# Patient Record
Sex: Female | Born: 1955 | Race: White | Hispanic: No | Marital: Married | Smoking: Never smoker
Health system: Southern US, Community
[De-identification: ages and names within clinical notes are randomized; demographics above are authoritative.]

## PROBLEM LIST (undated history)

## (undated) DIAGNOSIS — C4490 Unspecified malignant neoplasm of skin, unspecified: Secondary | ICD-10-CM

## (undated) DIAGNOSIS — Z889 Allergy status to unspecified drugs, medicaments and biological substances status: Secondary | ICD-10-CM

## (undated) DIAGNOSIS — E559 Vitamin D deficiency, unspecified: Secondary | ICD-10-CM

## (undated) DIAGNOSIS — I872 Venous insufficiency (chronic) (peripheral): Secondary | ICD-10-CM

## (undated) DIAGNOSIS — N200 Calculus of kidney: Secondary | ICD-10-CM

## (undated) DIAGNOSIS — L509 Urticaria, unspecified: Secondary | ICD-10-CM

## (undated) HISTORY — PX: ADENOIDECTOMY: SUR15

## (undated) HISTORY — PX: TONSILLECTOMY: SUR1361

## (undated) HISTORY — DX: Allergy status to unspecified drugs, medicaments and biological substances: Z88.9

## (undated) HISTORY — DX: Calculus of kidney: N20.0

## (undated) HISTORY — DX: Urticaria, unspecified: L50.9

## (undated) HISTORY — DX: Venous insufficiency (chronic) (peripheral): I87.2

## (undated) HISTORY — DX: Vitamin D deficiency, unspecified: E55.9

## (undated) HISTORY — PX: TONSILLECTOMY: SHX5217

## (undated) HISTORY — PX: DERMOID CYST  EXCISION: SHX1452

## (undated) HISTORY — PX: APPENDECTOMY: SHX54

## (undated) HISTORY — DX: Unspecified malignant neoplasm of skin, unspecified: C44.90

---

## 1982-02-17 DIAGNOSIS — N200 Calculus of kidney: Secondary | ICD-10-CM

## 1982-02-17 HISTORY — DX: Calculus of kidney: N20.0

## 2000-03-02 ENCOUNTER — Ambulatory Visit (HOSPITAL_COMMUNITY): Admission: RE | Admit: 2000-03-02 | Discharge: 2000-03-02 | Payer: Self-pay | Admitting: Obstetrics and Gynecology

## 2000-03-02 ENCOUNTER — Encounter: Payer: Self-pay | Admitting: Obstetrics and Gynecology

## 2000-04-07 ENCOUNTER — Other Ambulatory Visit: Admission: RE | Admit: 2000-04-07 | Discharge: 2000-04-07 | Payer: Self-pay | Admitting: Obstetrics and Gynecology

## 2001-08-03 ENCOUNTER — Encounter: Payer: Self-pay | Admitting: Obstetrics and Gynecology

## 2001-08-03 ENCOUNTER — Ambulatory Visit (HOSPITAL_COMMUNITY): Admission: RE | Admit: 2001-08-03 | Discharge: 2001-08-03 | Payer: Self-pay | Admitting: Obstetrics and Gynecology

## 2001-08-18 ENCOUNTER — Other Ambulatory Visit: Admission: RE | Admit: 2001-08-18 | Discharge: 2001-08-18 | Payer: Self-pay | Admitting: Obstetrics and Gynecology

## 2002-09-29 ENCOUNTER — Ambulatory Visit (HOSPITAL_COMMUNITY): Admission: RE | Admit: 2002-09-29 | Discharge: 2002-09-29 | Payer: Self-pay | Admitting: Obstetrics and Gynecology

## 2002-09-29 ENCOUNTER — Encounter: Payer: Self-pay | Admitting: Obstetrics and Gynecology

## 2002-10-04 ENCOUNTER — Other Ambulatory Visit: Admission: RE | Admit: 2002-10-04 | Discharge: 2002-10-04 | Payer: Self-pay | Admitting: Obstetrics and Gynecology

## 2003-10-26 ENCOUNTER — Other Ambulatory Visit: Admission: RE | Admit: 2003-10-26 | Discharge: 2003-10-26 | Payer: Self-pay | Admitting: Obstetrics and Gynecology

## 2003-10-26 ENCOUNTER — Ambulatory Visit (HOSPITAL_COMMUNITY): Admission: RE | Admit: 2003-10-26 | Discharge: 2003-10-26 | Payer: Self-pay | Admitting: Obstetrics and Gynecology

## 2003-11-02 ENCOUNTER — Encounter: Admission: RE | Admit: 2003-11-02 | Discharge: 2003-11-02 | Payer: Self-pay | Admitting: Obstetrics and Gynecology

## 2004-11-14 ENCOUNTER — Ambulatory Visit (HOSPITAL_COMMUNITY): Admission: RE | Admit: 2004-11-14 | Discharge: 2004-11-14 | Payer: Self-pay | Admitting: Obstetrics and Gynecology

## 2004-11-26 ENCOUNTER — Other Ambulatory Visit: Admission: RE | Admit: 2004-11-26 | Discharge: 2004-11-26 | Payer: Self-pay | Admitting: Obstetrics and Gynecology

## 2005-11-25 ENCOUNTER — Ambulatory Visit (HOSPITAL_COMMUNITY): Admission: RE | Admit: 2005-11-25 | Discharge: 2005-11-25 | Payer: Self-pay | Admitting: Obstetrics and Gynecology

## 2007-01-26 ENCOUNTER — Ambulatory Visit (HOSPITAL_COMMUNITY): Admission: RE | Admit: 2007-01-26 | Discharge: 2007-01-26 | Payer: Self-pay | Admitting: Obstetrics and Gynecology

## 2007-01-29 ENCOUNTER — Encounter: Admission: RE | Admit: 2007-01-29 | Discharge: 2007-01-29 | Payer: Self-pay | Admitting: Obstetrics and Gynecology

## 2007-08-10 ENCOUNTER — Encounter: Admission: RE | Admit: 2007-08-10 | Discharge: 2007-08-10 | Payer: Self-pay | Admitting: Obstetrics and Gynecology

## 2008-02-18 DIAGNOSIS — C4490 Unspecified malignant neoplasm of skin, unspecified: Secondary | ICD-10-CM

## 2008-02-18 HISTORY — DX: Unspecified malignant neoplasm of skin, unspecified: C44.90

## 2008-02-21 ENCOUNTER — Encounter: Admission: RE | Admit: 2008-02-21 | Discharge: 2008-02-21 | Payer: Self-pay | Admitting: Obstetrics and Gynecology

## 2009-04-09 ENCOUNTER — Ambulatory Visit (HOSPITAL_COMMUNITY): Admission: RE | Admit: 2009-04-09 | Discharge: 2009-04-09 | Payer: Self-pay | Admitting: Obstetrics and Gynecology

## 2010-05-09 ENCOUNTER — Other Ambulatory Visit (HOSPITAL_COMMUNITY): Payer: Self-pay | Admitting: Obstetrics and Gynecology

## 2010-05-09 DIAGNOSIS — Z1231 Encounter for screening mammogram for malignant neoplasm of breast: Secondary | ICD-10-CM

## 2010-05-16 ENCOUNTER — Ambulatory Visit (HOSPITAL_COMMUNITY)
Admission: RE | Admit: 2010-05-16 | Discharge: 2010-05-16 | Disposition: A | Discharge status: Home / Self Care | Payer: BC Managed Care – PPO | Source: Ambulatory Visit | Attending: Obstetrics and Gynecology | Admitting: Obstetrics and Gynecology

## 2010-05-16 DIAGNOSIS — Z1231 Encounter for screening mammogram for malignant neoplasm of breast: Secondary | ICD-10-CM | POA: Insufficient documentation

## 2011-05-21 ENCOUNTER — Telehealth: Payer: Self-pay

## 2011-05-21 NOTE — Telephone Encounter (Signed)
Sally Le, pt would like to speak with you about a personal matter. Please call.

## 2011-05-22 NOTE — Telephone Encounter (Signed)
Tried calling pt multpile times--number busy.

## 2011-05-23 NOTE — Telephone Encounter (Signed)
Pt notified. Ok with this

## 2011-05-23 NOTE — Telephone Encounter (Signed)
Spoke with patient and she stated that two weeks ago, she noticed blood in her stool.  It was bright red to dark red and lasted for 2 to 3 days..  She is taking an herb called stinging metal which has a coumadin property in it.  She was also taking 600 mg of ibuprofen and 200 iu of vitamin e along with this medicine.  She stopped the stinging metal and the symptoms went away but wanted to make sure she would be ok.  She has an appointment with Chelle on the 18th.

## 2011-05-23 NOTE — Telephone Encounter (Signed)
I think that waiting until the appt is ok because the symptoms have resolved but I would not take the supplements again.

## 2011-06-05 ENCOUNTER — Ambulatory Visit (INDEPENDENT_AMBULATORY_CARE_PROVIDER_SITE_OTHER): Payer: BC Managed Care – PPO | Admitting: Physician Assistant

## 2011-06-05 ENCOUNTER — Encounter: Payer: Self-pay | Admitting: Physician Assistant

## 2011-06-05 VITALS — BP 130/80 | HR 76 | Temp 98.6°F | Resp 16 | Ht 65.0 in | Wt 181.0 lb

## 2011-06-05 DIAGNOSIS — R002 Palpitations: Secondary | ICD-10-CM

## 2011-06-05 NOTE — Patient Instructions (Signed)
Keep working on healthy eating and regular exercise!  Let's recheck the cholesterol in the next 6 months (come fasting!).

## 2011-06-05 NOTE — Progress Notes (Signed)
  Subjective:    Patient ID: Sally Le, female    DOB: 08-01-1955, 56 y.o.   MRN: [id number]  HPI  Presents for re-evaluation of palpitations and recheck lipids after CPE. Is not fasting. Working on getting regular exercise, 5 x weekly, difficulty getting that in recently.  Has had no palpitations since her last visit.  Review of Systems No chest pain, SOB, HA, dizziness, vision change, N/V, diarrhea, dysuria, myalgias, arthralgias or rash.     Objective:   Physical Exam  Vital signs noted. Well-developed, well nourished WF who is awake, alert and oriented, in NAD. HEENT: [place]/AT, sclera and conjunctiva are clear.   Neck: supple, non-tender, no lymphadenopathy, thyromegaly. Heart: RRR, no murmur Lungs: CTA Skin: warm and dry without rash.       Assessment & Plan:   1. Palpitations -resolved  2.  Hyperlipidemia-repeat fasting lipids in 3-6 months, per her convenience.  In the meantime, continue efforts for healthy lifestyle changes.

## 2011-07-01 ENCOUNTER — Other Ambulatory Visit (HOSPITAL_COMMUNITY): Payer: Self-pay | Admitting: Obstetrics and Gynecology

## 2011-07-01 DIAGNOSIS — Z1231 Encounter for screening mammogram for malignant neoplasm of breast: Secondary | ICD-10-CM

## 2011-08-05 ENCOUNTER — Ambulatory Visit (HOSPITAL_COMMUNITY)
Admission: RE | Admit: 2011-08-05 | Discharge: 2011-08-05 | Disposition: A | Discharge status: Home / Self Care | Payer: BC Managed Care – PPO | Source: Ambulatory Visit | Attending: Obstetrics and Gynecology | Admitting: Obstetrics and Gynecology

## 2011-08-05 DIAGNOSIS — Z1231 Encounter for screening mammogram for malignant neoplasm of breast: Secondary | ICD-10-CM | POA: Insufficient documentation

## 2012-02-26 ENCOUNTER — Encounter: Payer: Self-pay | Admitting: Physician Assistant

## 2012-02-26 ENCOUNTER — Ambulatory Visit (INDEPENDENT_AMBULATORY_CARE_PROVIDER_SITE_OTHER): Payer: BC Managed Care – PPO | Admitting: Physician Assistant

## 2012-02-26 VITALS — BP 133/71 | HR 83 | Temp 98.6°F | Resp 16 | Ht 65.0 in | Wt 180.0 lb

## 2012-02-26 DIAGNOSIS — L219 Seborrheic dermatitis, unspecified: Secondary | ICD-10-CM | POA: Insufficient documentation

## 2012-02-26 DIAGNOSIS — Z0000 Encounter for general adult medical examination without abnormal findings: Secondary | ICD-10-CM

## 2012-02-26 LAB — CBC WITH DIFFERENTIAL/PLATELET
Basophils Absolute: 0 10*3/uL (ref 0.0–0.1)
Basophils Relative: 1 % (ref 0–1)
Eosinophils Absolute: 0.1 10*3/uL (ref 0.0–0.7)
Eosinophils Relative: 2 % (ref 0–5)
HCT: 41.3 % (ref 36.0–46.0)
Hemoglobin: 13.8 g/dL (ref 12.0–15.0)
Lymphocytes Relative: 32 % (ref 12–46)
Lymphs Abs: 1.7 10*3/uL (ref 0.7–4.0)
MCH: 30.2 pg (ref 26.0–34.0)
MCHC: 33.4 g/dL (ref 30.0–36.0)
MCV: 90.4 fL (ref 78.0–100.0)
Monocytes Absolute: 0.5 10*3/uL (ref 0.1–1.0)
Monocytes Relative: 10 % (ref 3–12)
Neutro Abs: 2.9 10*3/uL (ref 1.7–7.7)
Neutrophils Relative %: 55 % (ref 43–77)
Platelets: 250 10*3/uL (ref 150–400)
RBC: 4.57 MIL/uL (ref 3.87–5.11)
RDW: 13.8 % (ref 11.5–15.5)
WBC: 5.2 10*3/uL (ref 4.0–10.5)

## 2012-02-26 LAB — LIPID PANEL
Cholesterol: 217 mg/dL — ABNORMAL HIGH (ref 0–200)
HDL: 80 mg/dL (ref >39)
LDL Cholesterol: 125 mg/dL — ABNORMAL HIGH (ref 0–99)
Total CHOL/HDL Ratio: 2.7 Ratio
Triglycerides: 58 mg/dL (ref <150)
VLDL: 12 mg/dL (ref 0–40)

## 2012-02-26 LAB — POCT UA - MICROSCOPIC ONLY
Bacteria, U Microscopic: NEGATIVE
Casts, Ur, LPF, POC: NEGATIVE
Crystals, Ur, HPF, POC: NEGATIVE
Yeast, UA: NEGATIVE

## 2012-02-26 LAB — TSH: TSH: 1.506 u[IU]/mL (ref 0.350–4.500)

## 2012-02-26 LAB — POCT URINALYSIS DIPSTICK
Bilirubin, UA: NEGATIVE
Glucose, UA: NEGATIVE
Ketones, UA: NEGATIVE
Nitrite, UA: NEGATIVE
Protein, UA: NEGATIVE
Spec Grav, UA: <=1.005
Urobilinogen, UA: 0.2
pH, UA: 5.5

## 2012-02-26 LAB — COMPREHENSIVE METABOLIC PANEL
ALT: 18 U/L (ref 0–35)
AST: 22 U/L (ref 0–37)
Albumin: 4.6 g/dL (ref 3.5–5.2)
Alkaline Phosphatase: 72 U/L (ref 39–117)
BUN: 18 mg/dL (ref 6–23)
CO2: 28 mEq/L (ref 19–32)
Calcium: 10.1 mg/dL (ref 8.4–10.5)
Chloride: 107 mEq/L (ref 96–112)
Creat: 0.91 mg/dL (ref 0.50–1.10)
Glucose, Bld: 94 mg/dL (ref 70–99)
Potassium: 4.5 mEq/L (ref 3.5–5.3)
Sodium: 140 mEq/L (ref 135–145)
Total Bilirubin: 0.5 mg/dL (ref 0.3–1.2)
Total Protein: 7.2 g/dL (ref 6.0–8.3)

## 2012-02-26 NOTE — Progress Notes (Signed)
Subjective:    Patient ID: Sally Le, female    DOB: [birth year], 57 y.o.   MRN: [id number]  HPI This 57 y.o. female presents for Annual Wellness Exam.  Breast exam, mammogram and pap testing already done this year with GYN.  Colonoscopy current.  "I don't get flu shots."  Needs health maintenance exam and labs.  Past Medical History  Diagnosis Date  . Vitamin D deficiency   . Venous insufficiency   . Nephrolithiasis 1984  . History of seasonal allergies   . 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 Appendectomy   . Dermoid cyst  excision   . Tonsillectomy   . Adenoidectomy     Prior to Admission medications   Medication Sig Start Date End Date Taking? Authorizing Provider  AMBULATORY NON FORMULARY MEDICATION Take 1.5 oz by mouth daily. Medication Name: Xango mangosteen juice   Yes Historical Provider, MD  B Complex-C (B-COMPLEX WITH VITAMIN C) tablet Take 1 tablet by mouth daily.   Yes Historical Provider, MD  CALCIUM-MAGNESIUM PO Take by mouth 3 (three) times daily.   Yes Historical Provider, MD  CHERRY PO Take 500 mg by mouth daily.   Yes Historical Provider, MD  Cholecalciferol (VITAMIN D PO) Take 2,000 Units/day by mouth daily.   Yes Historical Provider, MD  magnesium 30 MG tablet Take 30 mg by mouth 2 (two) times daily.   Yes Historical Provider, MD  Omega-3 Fatty Acids (FISH OIL) 1200 MG CAPS Take 1,200 mg by mouth daily.   Yes Historical Provider, MD  VITAMIN E COMPLEX PO Take 200 Units by mouth daily.   Yes Historical Provider, MD    Allergies  Allergen Reactions  . Adhesive (Tape) Rash    The adhesive, not the tape is the source of the problem.  . Erythromycin     nausea    History   Social History  . Marital Status: Married    Spouse Name: Daphine Deutscher    Number of Children: 0  . Years of Education: 15   Occupational History  . Teacher    Social History Main Topics  . Smoking  status: Never Smoker   . Smokeless tobacco: Never Used  . Alcohol Use: 0.6 oz/week    1 Glasses of wine per week  . Drug Use: No  . Sexually Active: Yes -- Female partner(s)    Birth Control/ Protection: Post-menopausal   Other Topics Concern  . Not on file   Social History Narrative   Exercises 4xweekly.  Lives with her husband.    Family History  Problem Relation Age of Onset  . Parkinson's disease Mother   . Breast cancer Mother   . Cancer Mother 12    Estrogen and progestin positive breast cancer  . Cancer Father     oat cell lung CA with mets to brain  . Pancreatic cancer Maternal Grandfather   . Cancer Maternal Grandfather     pancreatic  . Lung cancer    . Obesity Sister   . Spondylolisthesis Sister   . Asthma Sister   . Cancer Maternal Aunt 58    lung cancer (non-smoker)  . Heart disease Paternal Grandmother     Review of Systems  Constitutional: Negative.   HENT: Negative.   Eyes: Negative.   Respiratory: Negative.   Cardiovascular: Positive for palpitations and leg swelling. Negative for chest pain.  Gastrointestinal: Negative.  Genitourinary: Negative.   Musculoskeletal: Negative.   Skin: Negative.   Neurological: Negative.   Hematological: Negative.   Psychiatric/Behavioral: Negative.        Objective:   Physical Exam  Vitals reviewed. Constitutional: She is oriented to person, place, and time. Vital signs are normal. She appears well-developed and well-nourished. No distress.  HENT:  Head: Normocephalic and atraumatic.  Right Ear: Hearing, tympanic membrane, external ear and ear canal normal. No foreign bodies.  Left Ear: Hearing, tympanic membrane, external ear and ear canal normal. No foreign bodies.  Nose: Nose normal.  Mouth/Throat: Uvula is midline, oropharynx is clear and moist and mucous membranes are normal. No oral lesions. Normal dentition. No dental abscesses or uvula swelling. No oropharyngeal exudate.  Eyes: Conjunctivae normal and  EOM are normal. Pupils are equal, round, and reactive to light. Right eye exhibits no discharge. Left eye exhibits no discharge. No scleral icterus.  Fundoscopic exam:      The right eye shows no arteriolar narrowing, no AV nicking, no exudate, no hemorrhage and no papilledema. The right eye shows red reflex.The right eye shows no venous pulsations.      The left eye shows no arteriolar narrowing, no AV nicking, no exudate, no hemorrhage and no papilledema. The left eye shows red reflex.The left eye shows no venous pulsations. Neck: Trachea normal, normal range of motion and full passive range of motion without pain. Neck supple. No spinous process tenderness and no muscular tenderness present. No mass and no thyromegaly present.  Cardiovascular: Normal rate, regular rhythm, normal heart sounds, intact distal pulses and normal pulses.   Pulmonary/Chest: Effort normal and breath sounds normal.  Musculoskeletal: She exhibits no edema and no tenderness.       Cervical back: Normal.       Thoracic back: Normal.       Lumbar back: Normal.  Lymphadenopathy:       Head (right side): No tonsillar, no preauricular, no posterior auricular and no occipital adenopathy present.       Head (left side): No tonsillar, no preauricular, no posterior auricular and no occipital adenopathy present.    She has no cervical adenopathy.       Right: No supraclavicular adenopathy present.       Left: No supraclavicular adenopathy present.  Neurological: She is alert and oriented to person, place, and time. She has normal strength and normal reflexes. No cranial nerve deficit. She exhibits normal muscle tone. Coordination and gait normal.  Skin: Skin is warm, dry and intact. No rash noted. She is not diaphoretic. No cyanosis or erythema. Nails show no clubbing.  Psychiatric: She has a normal mood and affect. Her speech is normal and behavior is normal. Judgment and thought content normal.   Results for orders placed in  visit on 02/26/12  POCT URINALYSIS DIPSTICK      Component Value Range   Color, UA yellow     Clarity, UA clear     Glucose, UA neg     Bilirubin, UA neg     Ketones, UA neg     Spec Grav, UA <=1.005     Blood, UA trace     pH, UA 5.5     Protein, UA neg     Urobilinogen, UA 0.2     Nitrite, UA neg     Leukocytes, UA Trace    POCT UA - MICROSCOPIC ONLY      Component Value Range   WBC, Ur, HPF, POC 0-3  RBC, urine, microscopic 0-2     Bacteria, U Microscopic neg     Mucus, UA trace     Epithelial cells, urine per micros 0-2     Crystals, Ur, HPF, POC neg     Casts, Ur, LPF, POC neg     Yeast, UA neg        Assessment & Plan:   1. Routine general medical examination at a health care facility  POCT urinalysis dipstick, POCT UA - Microscopic Only, CBC with Differential, Comprehensive metabolic panel, Lipid panel, TSH  2. Seborrheic dermatitis  Continue mangosteen juice   Age appropriate anticipatory guidance provided.

## 2012-02-26 NOTE — Patient Instructions (Signed)

## 2012-02-27 ENCOUNTER — Encounter: Payer: Self-pay | Admitting: Physician Assistant

## 2012-04-03 ENCOUNTER — Other Ambulatory Visit: Payer: Self-pay

## 2012-09-21 ENCOUNTER — Other Ambulatory Visit (HOSPITAL_COMMUNITY): Payer: Self-pay | Admitting: Obstetrics and Gynecology

## 2012-09-21 DIAGNOSIS — Z1231 Encounter for screening mammogram for malignant neoplasm of breast: Secondary | ICD-10-CM

## 2012-10-14 ENCOUNTER — Ambulatory Visit (HOSPITAL_COMMUNITY): Payer: BC Managed Care – PPO

## 2012-10-14 ENCOUNTER — Ambulatory Visit (HOSPITAL_COMMUNITY)
Admission: RE | Admit: 2012-10-14 | Discharge: 2012-10-14 | Disposition: A | Discharge status: Home / Self Care | Payer: BC Managed Care – PPO | Source: Ambulatory Visit | Attending: Obstetrics and Gynecology | Admitting: Obstetrics and Gynecology

## 2012-10-14 DIAGNOSIS — Z1231 Encounter for screening mammogram for malignant neoplasm of breast: Secondary | ICD-10-CM | POA: Insufficient documentation

## 2012-12-23 ENCOUNTER — Other Ambulatory Visit: Payer: Self-pay

## 2013-05-16 ENCOUNTER — Telehealth: Payer: Self-pay

## 2013-05-16 NOTE — Telephone Encounter (Signed)
Error

## 2013-09-07 ENCOUNTER — Other Ambulatory Visit (HOSPITAL_COMMUNITY): Payer: Self-pay | Admitting: Obstetrics and Gynecology

## 2013-09-07 DIAGNOSIS — Z1231 Encounter for screening mammogram for malignant neoplasm of breast: Secondary | ICD-10-CM

## 2013-10-27 ENCOUNTER — Ambulatory Visit (HOSPITAL_COMMUNITY)
Admission: RE | Admit: 2013-10-27 | Discharge: 2013-10-27 | Disposition: A | Discharge status: Home / Self Care | Payer: BC Managed Care – PPO | Source: Ambulatory Visit | Attending: Obstetrics and Gynecology | Admitting: Obstetrics and Gynecology

## 2013-10-27 DIAGNOSIS — Z1231 Encounter for screening mammogram for malignant neoplasm of breast: Secondary | ICD-10-CM | POA: Diagnosis not present

## 2013-12-02 ENCOUNTER — Other Ambulatory Visit: Payer: Self-pay

## 2014-11-10 ENCOUNTER — Other Ambulatory Visit (HOSPITAL_COMMUNITY): Payer: Self-pay | Admitting: Obstetrics and Gynecology

## 2014-11-10 DIAGNOSIS — Z1231 Encounter for screening mammogram for malignant neoplasm of breast: Secondary | ICD-10-CM

## 2014-11-13 ENCOUNTER — Ambulatory Visit (HOSPITAL_COMMUNITY)
Admission: RE | Admit: 2014-11-13 | Discharge: 2014-11-13 | Disposition: A | Discharge status: Home / Self Care | Payer: BLUE CROSS/BLUE SHIELD | Source: Ambulatory Visit | Attending: Obstetrics and Gynecology | Admitting: Obstetrics and Gynecology

## 2014-11-13 DIAGNOSIS — Z1231 Encounter for screening mammogram for malignant neoplasm of breast: Secondary | ICD-10-CM | POA: Diagnosis not present

## 2015-05-21 IMAGING — MG MM DIGITAL SCREENING BILAT
4 series · 4 of 4 positions shown · non-contrast
Comparison: Previous exam(s).

CLINICAL DATA: Screening.

EXAM:
DIGITAL SCREENING BILATERAL MAMMOGRAM WITH CAD

[L CC]
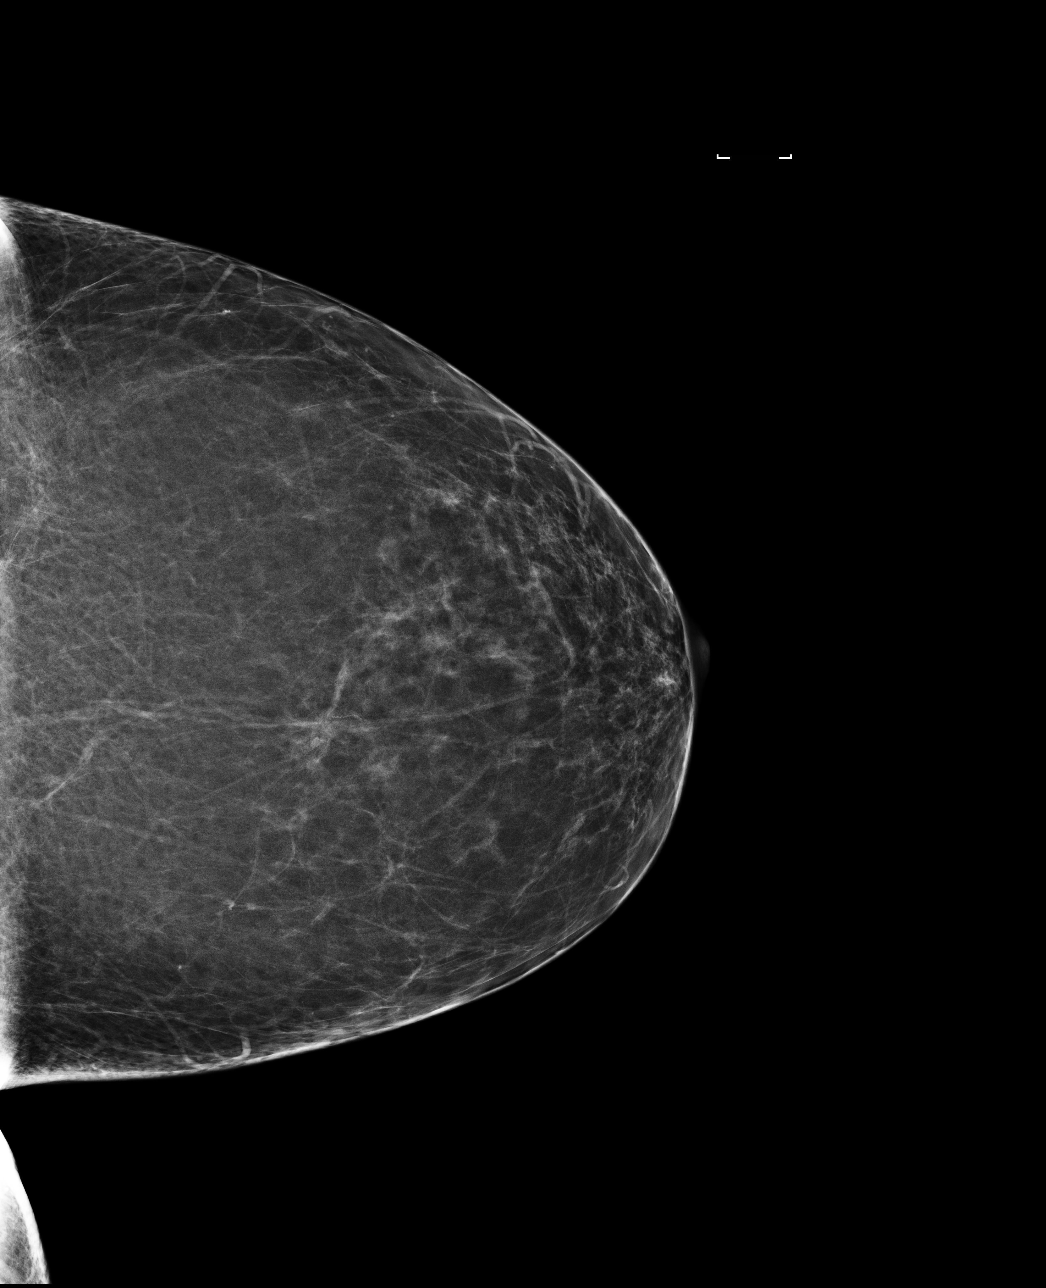

[R MLO]
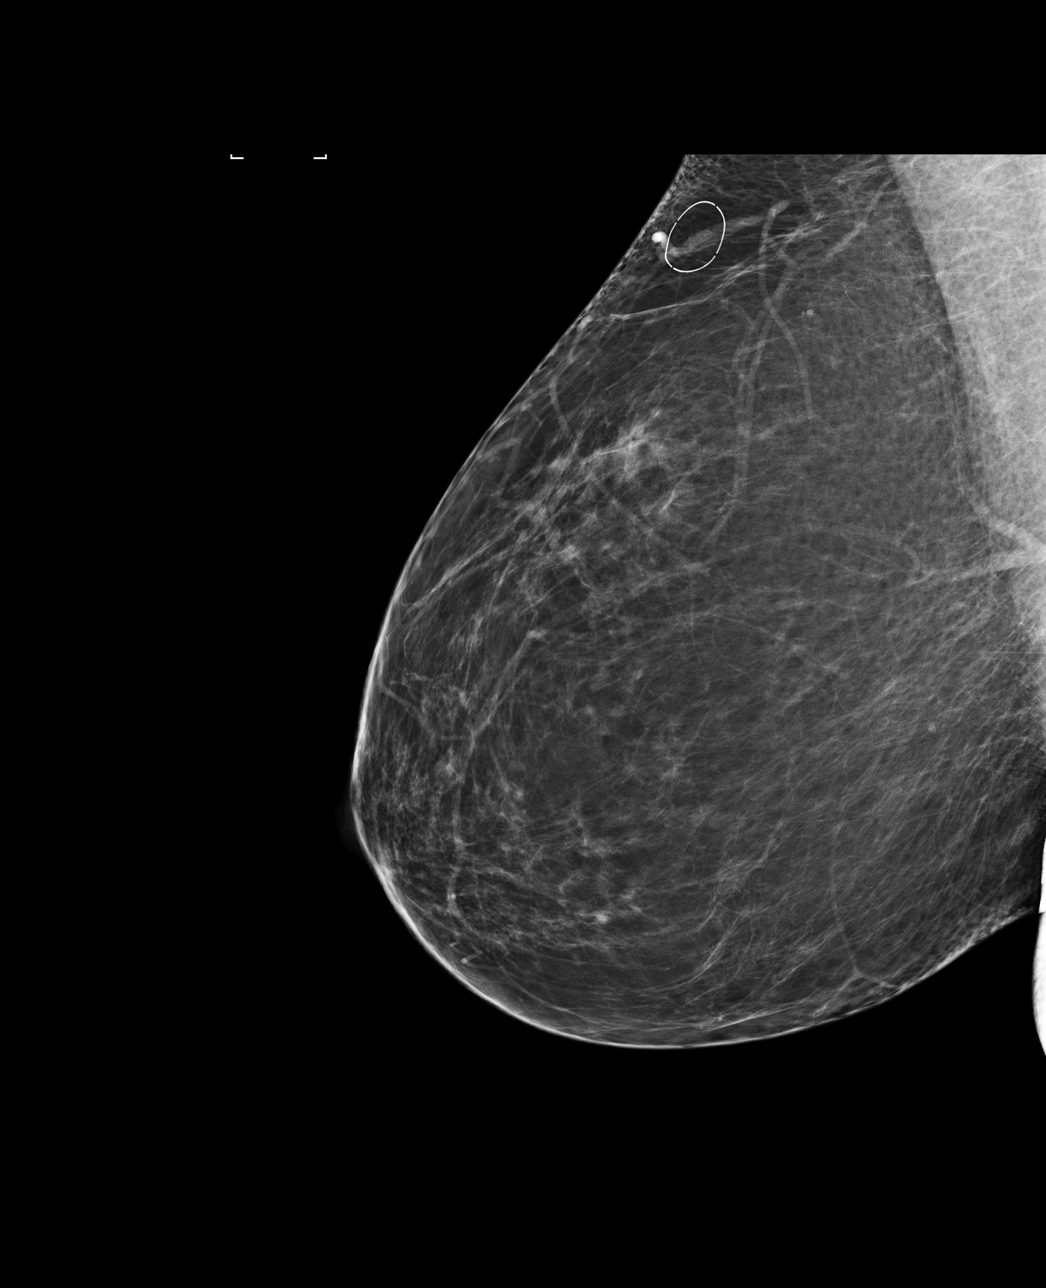

[L MLO]
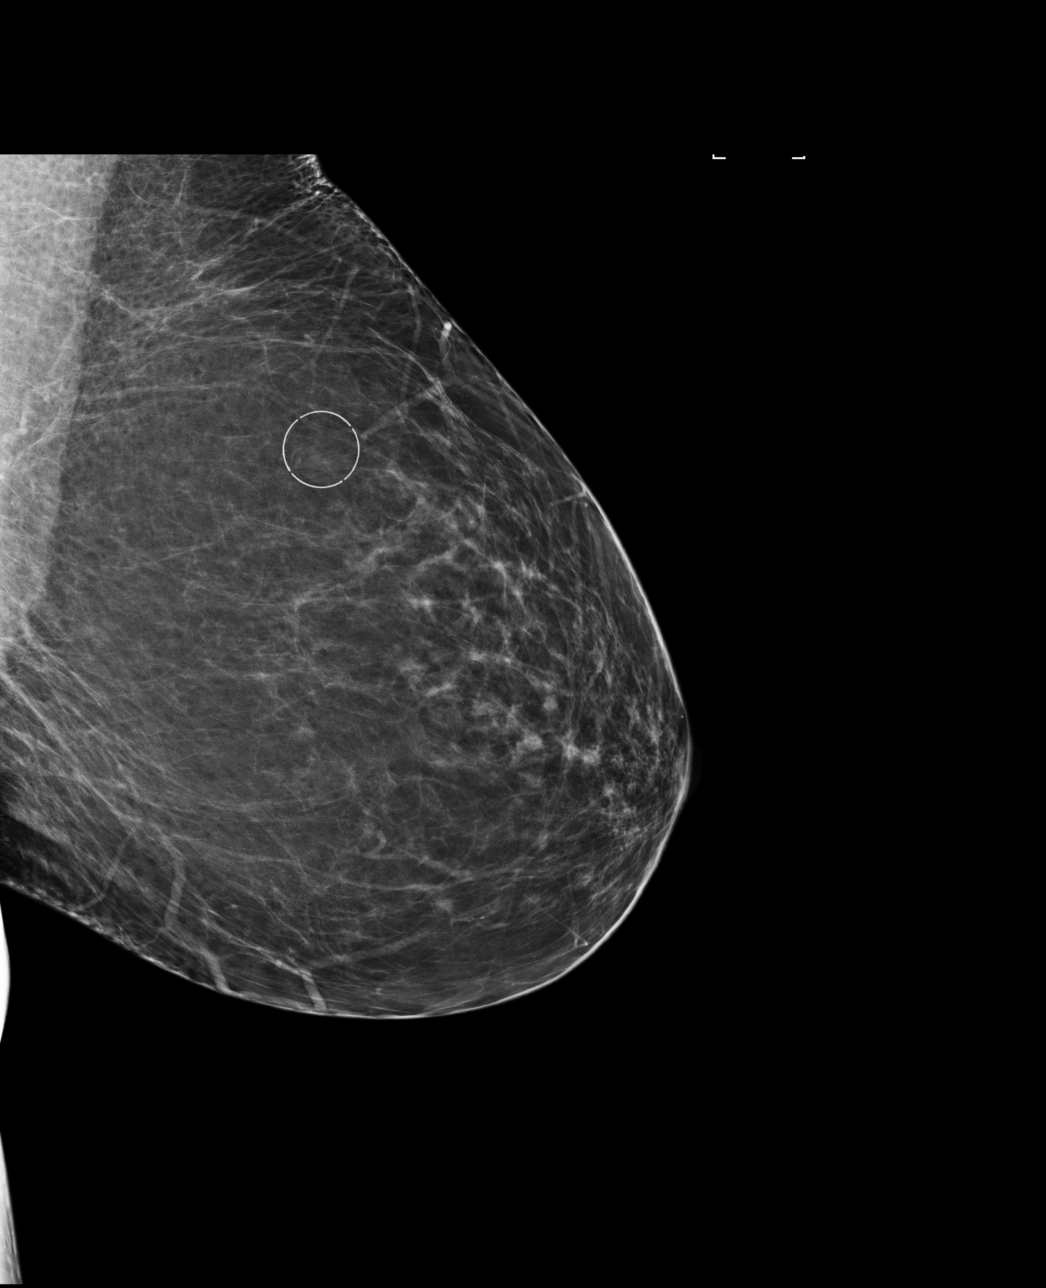

[R CC]
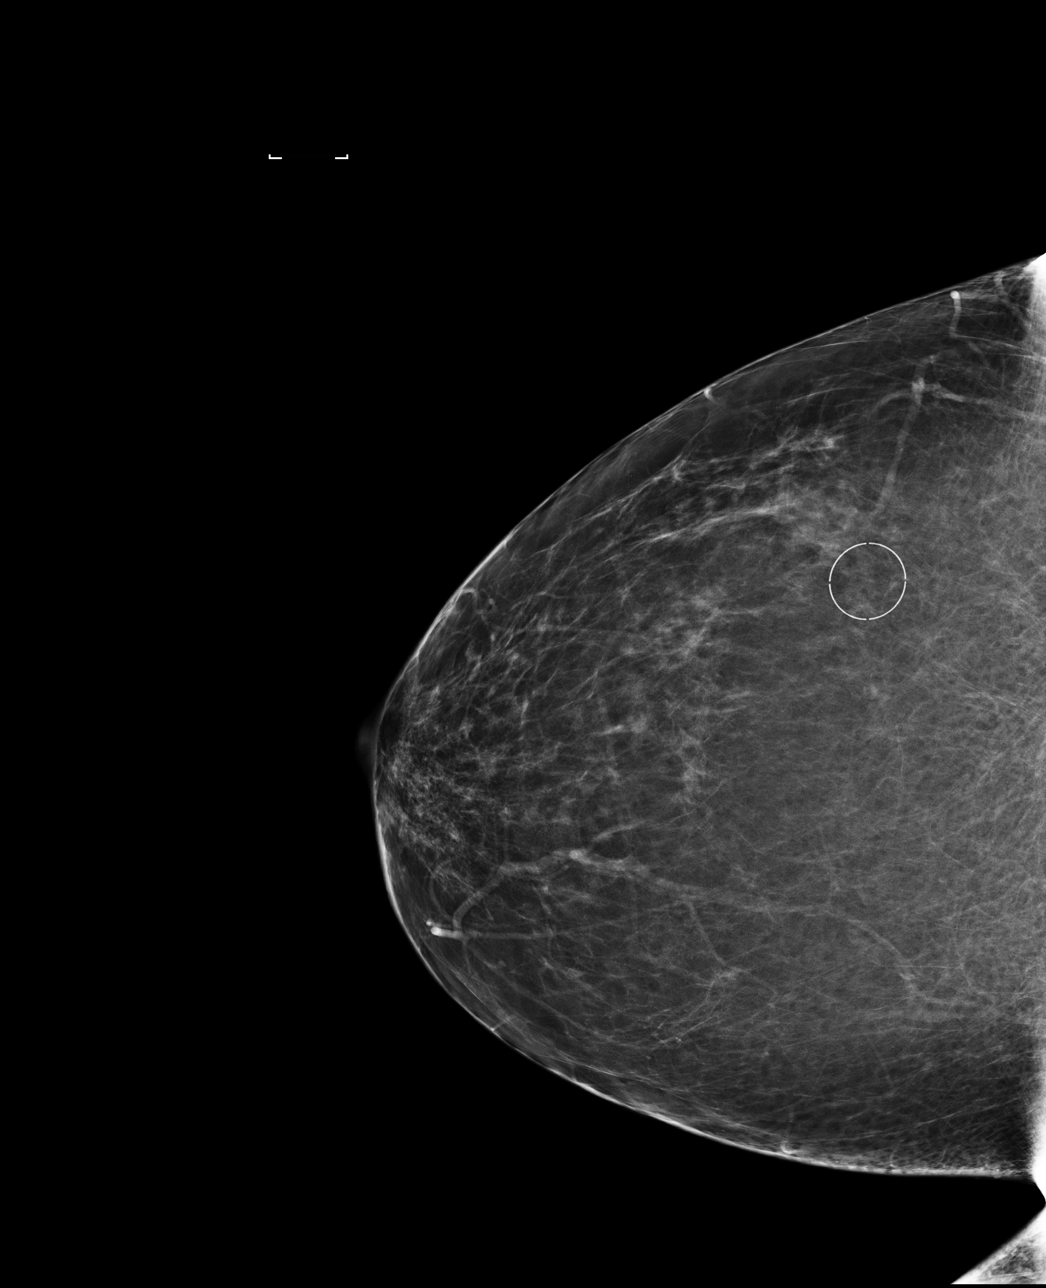

[4 of 4 positions shown; findings below may reference images not displayed]

ACR Breast Density Category b: There are scattered areas of
fibroglandular density.
FINDINGS: There are no findings suspicious for malignancy. Images were
processed with CAD.
IMPRESSION: No mammographic evidence of malignancy. A result letter of this
screening mammogram will be mailed directly to the patient.

RECOMMENDATION:
Screening mammogram in one year. (Code:AS-G-LCT)

BI-RADS CATEGORY  1: Negative.

## 2015-12-28 ENCOUNTER — Telehealth: Payer: Self-pay

## 2016-01-01 ENCOUNTER — Ambulatory Visit: Payer: BLUE CROSS/BLUE SHIELD

## 2016-06-04 NOTE — Telephone Encounter (Signed)
error 

## 2017-05-05 ENCOUNTER — Encounter: Payer: Self-pay | Admitting: Allergy and Immunology

## 2017-05-05 ENCOUNTER — Ambulatory Visit: Payer: BLUE CROSS/BLUE SHIELD | Admitting: Allergy and Immunology

## 2017-05-05 DIAGNOSIS — T7840XD Allergy, unspecified, subsequent encounter: Secondary | ICD-10-CM

## 2017-05-05 DIAGNOSIS — T7840XA Allergy, unspecified, initial encounter: Secondary | ICD-10-CM | POA: Insufficient documentation

## 2017-05-05 DIAGNOSIS — Z889 Allergy status to unspecified drugs, medicaments and biological substances status: Secondary | ICD-10-CM | POA: Diagnosis not present

## 2017-05-05 NOTE — Progress Notes (Signed)
New Patient Note  RE: Sally Le MRN: [id number] DOB: 11/27/1955 Date of Office Visit: 05/05/2017  Referring provider: Linus Mako, MD Primary care provider: Marda Stalker, PA-C  Chief Complaint: Allergic Reaction and Urticaria   History of present illness: Sally Le is a 62 y.o. female seen today in consultation requested by Linus Mako, MD.  She reports that she underwent sclerotherapy in 2008 in 2009.  The day after her last treatment in 2009, she experienced pruritus of her hands and feet.  In 2017, she underwent sclerotherapy again and the next day developed pruritus of her hands and feet and believes that she may have developed a few urticarial lesions as well.  2 weeks later, she underwent another round of sclerotherapy and within 12-24 hours developed urticarial lesions at the injection site.  She took antihistamines in an attempt to control the urticaria.  The technician noted that she had rested her latex-gloved hand on Lemma's leg in an area where the urticarial lesions became particularly dense.  She did not experience concomitant angioedema, cardiopulmonary symptoms, or GI symptoms.   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  Katharyn will return in the near future for latex skin testing and, if negative, challenge.  If latex skin test and challenge are both negative, we will proceed to skin testing and challenge of local anesthetic agent.  This agent will be determined by the patient's dermatologist's and dentist's preference.  If testing to latex and local anesthetic agent are both negative, the microfoam sclerotherapy agent is the most likely culprit and an alternative agent will need to be considered.     Physical examination: Blood pressure (!) 142/70, pulse 96, temperature 98.1 F (36.7 C), temperature source Oral, resp.  rate 20, height 5\' 5"  (1.651 m), weight 181 lb 3.2 oz (82.2 kg), last menstrual period 06/05/2011.  General: Alert, interactive, in no acute distress. Neck: Supple without lymphadenopathy. Lungs: Clear to auscultation without wheezing, rhonchi or rales. CV: Normal S1, S2 without murmurs. Abdomen: Nondistended, nontender. Skin: Warm and dry, without lesions or rashes. Extremities:  No clubbing, cyanosis or edema. Neuro:   Grossly intact.  Review of systems:  Review of systems negative except as noted in HPI / PMHx or noted below: Review of Systems  Constitutional: Negative.   HENT: Negative.   Eyes: Negative.   Respiratory: Negative.   Cardiovascular: Negative.   Gastrointestinal: Negative.   Genitourinary: Negative.   Musculoskeletal: Negative.   Skin: Negative.   Neurological: Negative.   Endo/Heme/Allergies: Negative.   Psychiatric/Behavioral: Negative.     Past medical history:  Past Medical History:  Diagnosis Date  . History of seasonal allergies   . Nephrolithiasis 1984  . 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 Urticaria   . Venous insufficiency   . Vitamin D deficiency     Past surgical history:  Past Surgical History:  Procedure Laterality Date  . ADENOIDECTOMY    . APPENDECTOMY    . DERMOID CYST  EXCISION    . TONSILLECTOMY    . TONSILLECTOMY      Family history: Family History  Problem Relation Age of Onset  . Parkinson's disease Mother   . Breast cancer Mother   . Cancer Mother 12       Estrogen and progestin positive breast cancer  . Cancer Father        oat  cell lung CA with mets to brain  . Pancreatic cancer Maternal Grandfather   . Cancer Maternal Grandfather        pancreatic  . Obesity Sister   . Spondylolisthesis Sister   . Asthma Sister   . Cancer Maternal Aunt 86       lung cancer (non-smoker)  . Heart disease Paternal Grandmother   . Lung cancer Unknown     Social history: Social History     Socioeconomic History  . Marital status: Married    Spouse name: Hassell Done  . Number of children: 0  . Years of education: 23  . Highest education level: Not on file  Social Needs  . Financial resource strain: Not on file  . Food insecurity - worry: Not on file  . Food insecurity - inability: Not on file  . Transportation needs - medical: Not on file  . Transportation needs - non-medical: Not on file  Occupational History  . Occupation: Product manager: [employer]  Tobacco Use  . Smoking status: Never Smoker  . Smokeless tobacco: Never Used  Substance and Sexual Activity  . Alcohol use: Yes    Alcohol/week: 0.6 oz    Types: 1 Glasses of wine per week  . Drug use: No  . Sexual activity: Yes    Partners: Male    Birth control/protection: Post-menopausal  Other Topics Concern  . Not on file  Social History Narrative   Exercises 4xweekly.  Lives with her husband.   Environmental History: The patient lives in a 64 year old house with carpeting throughout and central air/heat.  She is a non-smoker without pets.  There is no known mold/water damage in the home.  Allergies as of 05/05/2017      Reactions   Adhesive [tape] Rash   The adhesive, not the tape is the source of the problem.   Erythromycin    nausea      Medication List        Accurate as of 05/05/17  5:47 PM. Always use your most recent med list.          AMBULATORY NON FORMULARY MEDICATION Take 1.5 oz by mouth daily. Medication Name: Shea Evans mangosteen juice   B-complex with vitamin C tablet Take 1 tablet by mouth daily.   CALCIUM-MAGNESIUM PO Take by mouth 3 (three) times daily.   CHERRY PO Take 500 mg by mouth daily.   Fish Oil 1200 MG Caps Take 1,200 mg by mouth daily.   magnesium 30 MG tablet Take 30 mg by mouth 2 (two) times daily.   VITAMIN D PO Take 2,000 Units/day by mouth daily.   VITAMIN E COMPLEX PO Take 200 Units by mouth daily.       Known  medication allergies: Allergies  Allergen Reactions  . Adhesive [Tape] Rash    The adhesive, not the tape is the source of the problem.  . Erythromycin     nausea    I appreciate the opportunity to take part in Yaire's care. Please do not hesitate to contact me with questions.  Sincerely,   R. Edgar Frisk, MD

## 2017-05-05 NOTE — Assessment & Plan Note (Addendum)
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  Sally Le will return in the near future for latex skin testing and, if negative, challenge.  If latex skin test and challenge are both negative, we will proceed to skin testing and challenge of local anesthetic agent.  This agent will be determined by the patient's dermatologist's and dentist's preference.  If testing to latex and local anesthetic agent are both negative, the microfoam sclerotherapy agent is the most likely culprit and an alternative agent will need to be considered.

## 2017-05-05 NOTE — Patient Instructions (Signed)
Allergic reaction The patient's history suggests hypersensitivity to latex, local anesthetic caine agent, or micro-foam sclerotherapy agent.  Sally Le will return in the near future for latex skin testing and, if negative, challenge.  If latex skin test and challenge are both negative, we will proceed to skin testing and challenge of local anesthetic agent.  This agent will be determined by the patient's dermatologist's and dentist's preference.  If testing to latex and local anesthetic agent are both negative, the microfoam sclerotherapy agent is the most likely culprit and an alternative agent will need to be considered.   Return for latex testing/challenge.

## 2017-06-08 ENCOUNTER — Ambulatory Visit: Payer: BLUE CROSS/BLUE SHIELD | Admitting: Allergy and Immunology

## 2017-06-08 ENCOUNTER — Encounter: Payer: Self-pay | Admitting: Allergy and Immunology

## 2017-06-08 VITALS — BP 130/78 | HR 79 | Resp 18

## 2017-06-08 DIAGNOSIS — T7840XD Allergy, unspecified, subsequent encounter: Secondary | ICD-10-CM | POA: Diagnosis not present

## 2017-06-08 DIAGNOSIS — Z889 Allergy status to unspecified drugs, medicaments and biological substances status: Secondary | ICD-10-CM | POA: Diagnosis not present

## 2017-06-08 DIAGNOSIS — L500 Allergic urticaria: Secondary | ICD-10-CM | POA: Diagnosis not present

## 2017-06-08 NOTE — Assessment & Plan Note (Signed)
Sally Le's history suggests hypersensitivity to latex, local anesthetic caine agent, or micro-foam sclerotherapy agent.  Skin tests to latex were negative today at all delutions despite a positive histamine control.  In addition, she was able to tolerate wearing latex gloves, 1 with powder and 1 without powder, during the challenge period without symptoms.  Sally Le will return in the near future for lidocaine skin testing and, if negative, challenge.  If lidocaine skin test and challenge are negative, the microfoam sclerotherapy agent is the most likely culprit and an alternative agent will need to be considered.

## 2017-06-08 NOTE — Patient Instructions (Signed)
Allergic reaction Sally Le's history suggests hypersensitivity to latex, local anesthetic caine agent, or micro-foam sclerotherapy agent.  Skin tests to latex were negative today at all delutions despite a positive histamine control.  In addition, she was able to tolerate wearing latex gloves, 1 with powder and 1 without powder, during the challenge period without symptoms.  Sally Le will return in the near future for lidocaine skin testing and, if negative, challenge.  If lidocaine skin test and challenge are negative, the microfoam sclerotherapy agent is the most likely culprit and an alternative agent will need to be considered.   Return for lidocaine skin test/challenge.

## 2017-06-08 NOTE — Progress Notes (Signed)
Follow-up Note  RE: Sally Le MRN: [id number] DOB: July 18, 1955 Date of Office Visit: 06/08/2017  Primary care provider: Marda Stalker, PA-C Referring provider: Linus Mako, MD  History of present illness: Sally Le is a 62 y.o. female with history of allergic reactions here today for latex testing and, if negative, challenge.  She was previously seen in this clinic for her initial evaluation on May 05, 2017.  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  It is unclear if these reactions were due to contact with latex, the local anesthetic agent used, or the Microfoam agent used during the procedure.  Please see history of present illness from that visit for details.  Assessment and plan: Allergic reaction Sally Le history suggests hypersensitivity to latex, local anesthetic caine agent, or micro-foam sclerotherapy agent.  Skin tests to latex were negative today at all delutions despite a positive histamine control.  In addition, she was able to tolerate wearing latex gloves, 1 with powder and 1 without powder, during the challenge period without symptoms.  Sally Le will return in the near future for lidocaine skin testing and, if negative, challenge.  If lidocaine skin test and challenge are negative, the microfoam sclerotherapy agent is the most likely culprit and an alternative agent will need to be considered.   Diagnostics: Skin tests to latex were negative today at all delutions despite a positive histamine control.  In addition, she was able to tolerate wearing latex gloves, 1 with powder and 1 without powder, during the 15 minute challenge period without symptoms. She was asymptomatic with stable vital signs throughout the 1 hour observation period following.     Physical examination: Blood pressure 130/78, pulse 79, resp. rate 18, last menstrual period 06/05/2011, SpO2 97 %.  General: Alert, interactive, in no acute distress. Neck: Supple  without lymphadenopathy. Lungs: Clear to auscultation without wheezing, rhonchi or rales. CV: Normal S1, S2 without murmurs. Skin: Warm and dry, without lesions or rashes.  The following portions of the patient's history were reviewed and updated as appropriate: allergies, current medications, past family history, past medical history, past social history, past surgical history and problem list.  Allergies as of 06/08/2017      Reactions   Adhesive [tape] Rash   The adhesive, not the tape is the source of the problem.   Erythromycin    nausea      Medication List        Accurate as of 06/08/17  7:21 PM. Always use your most recent med list.          AMBULATORY NON FORMULARY MEDICATION Take 1.5 oz by mouth daily. Medication Name: Shea Evans mangosteen juice   B-complex with vitamin C tablet Take 1 tablet by mouth daily.   CALCIUM-MAGNESIUM PO Take by mouth 3 (three) times daily.   CHERRY PO Take 500 mg by mouth daily.   Fish Oil 1200 MG Caps Take 1,200 mg by mouth daily.   magnesium 30 MG tablet Take 30 mg by mouth 2 (two) times daily.   nystatin cream Commonly known as:  MYCOSTATIN   VITAMIN D PO Take 2,000 Units/day by mouth daily.   VITAMIN E COMPLEX PO Take 200 Units by mouth daily.       Allergies  Allergen Reactions  . Adhesive [Tape] Rash    The adhesive, not the tape is the source of the problem.  . Erythromycin     nausea   Review of systems: Review of systems negative except  as noted in HPI / PMHx or noted below: Constitutional: Negative.  HENT: Negative.   Eyes: Negative.  Respiratory: Negative.   Cardiovascular: Negative.  Gastrointestinal: Negative.  Genitourinary: Negative.  Musculoskeletal: Negative.  Neurological: Negative.  Endo/Heme/Allergies: Negative.  Cutaneous: Negative.  Past Medical History:  Diagnosis Date  . History of seasonal allergies   . Nephrolithiasis 1984  . 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, pre-basal  cell RIGHT cheek  . Urticaria   . Venous insufficiency   . Vitamin D deficiency     Family History  Problem Relation Age of Onset  . Parkinson's disease Mother   . Breast cancer Mother   . Cancer Mother 14       Estrogen and progestin positive breast cancer  . Cancer Father        oat cell lung CA with mets to brain  . Pancreatic cancer Maternal Grandfather   . Cancer Maternal Grandfather        pancreatic  . Obesity Sister   . Spondylolisthesis Sister   . Asthma Sister   . Cancer Maternal Aunt 86       lung cancer (non-smoker)  . Heart disease Paternal Grandmother   . Lung cancer Unknown     Social History   Socioeconomic History  . Marital status: Married    Spouse name: Hassell Done  . Number of children: 0  . Years of education: 33  . Highest education level: Not on file  Occupational History  . Occupation: Product manager: [employer]  . Financial resource strain: Not on file  . Food insecurity:    Worry: Not on file    Inability: Not on file  . Transportation needs:    Medical: Not on file    Non-medical: Not on file  Tobacco Use  . Smoking status: Never Smoker  . Smokeless tobacco: Never Used  Substance and Sexual Activity  . Alcohol use: Yes    Alcohol/week: 0.6 oz    Types: 1 Glasses of wine per week  . Drug use: No  . Sexual activity: Yes    Partners: Male    Birth control/protection: Post-menopausal  Lifestyle  . Physical activity:    Days per week: Not on file    Minutes per session: Not on file  . Stress: Not on file  Relationships  . Social connections:    Talks on phone: Not on file    Gets together: Not on file    Attends religious service: Not on file    Active member of club or organization: Not on file    Attends meetings of clubs or organizations: Not on file    Relationship status: Not on file  . Intimate partner violence:    Fear of current or ex partner: Not on file    Emotionally  abused: Not on file    Physically abused: Not on file    Forced sexual activity: Not on file  Other Topics Concern  . Not on file  Social History Narrative   Exercises 4xweekly.  Lives with her husband.    I appreciate the opportunity to take part in Sally Le's care. Please do not hesitate to contact me with questions.  Sincerely,   R. Edgar Frisk, MD

## 2017-07-07 ENCOUNTER — Ambulatory Visit: Payer: BLUE CROSS/BLUE SHIELD | Admitting: Allergy and Immunology

## 2017-07-07 ENCOUNTER — Encounter: Payer: Self-pay | Admitting: Allergy and Immunology

## 2017-07-07 VITALS — BP 140/88 | HR 60 | Resp 16

## 2017-07-07 DIAGNOSIS — T7840XD Allergy, unspecified, subsequent encounter: Secondary | ICD-10-CM | POA: Diagnosis not present

## 2017-07-07 DIAGNOSIS — L500 Allergic urticaria: Secondary | ICD-10-CM

## 2017-07-07 DIAGNOSIS — Z889 Allergy status to unspecified drugs, medicaments and biological substances status: Secondary | ICD-10-CM | POA: Diagnosis not present

## 2017-07-07 NOTE — Assessment & Plan Note (Signed)
The patient had negative epicutaneous and intradermal tests and was able to tolerate the subcutaneous challenge today without adverse signs or symptoms. Therefore, she has the same risk of systemic reaction associated with lidocaine use as the general population. Given the negative test/challenge to latex and lidocaine, her reactions are most likely secondary to the Microfoam agent used for sclerotherapy.

## 2017-07-07 NOTE — Patient Instructions (Addendum)
Allergic reaction The patient had negative epicutaneous and intradermal tests and was able to tolerate the subcutaneous challenge today without adverse signs or symptoms. Therefore, she has the same risk of systemic reaction associated with lidocaine use as the general population. Given the negative test/challenge to latex and lidocaine, her reactions are most likely secondary to the Microfoam agent used for sclerotherapy.

## 2017-07-07 NOTE — Progress Notes (Signed)
    Follow-up Note  RE: Sally Le MRN: [id number] DOB: May 13, 1955 Date of Office Visit: 07/07/2017  Primary care provider: Marda Stalker, PA-C Referring provider: Linus Mako, MD  History of present illness: Sally Le is a 62 y.o. female with a history of allergic reactions presenting today for lidocaine testing and challenge.  She was last seen in this clinic on June 08, 2017 at which time she underwent latex testing and challenge.  The latex testing and challenge were negative.   ---------------     Assessment and plan: Allergic reaction The patient had negative epicutaneous and intradermal tests and was able to tolerate the subcutaneous challenge today without adverse signs or symptoms. Therefore, she has the same risk of systemic reaction associated with lidocaine use as the general population. Given the negative test/challenge to latex and lidocaine, her reactions are most likely secondary to the Microfoam agent used for sclerotherapy.   No orders of the defined types were placed in this encounter.   Diagnostics: Lidocaine skin testing: Negative epicutaneous and intradermal tests at all dilution despite a positive histamine control. Lidocaine subcutaneous challenge:  The patient was able to tolerate the challenge today without adverse signs or symptoms. Vital signs were stable throughout the challenge and observation period.      Physical examination: Blood pressure 118/70, pulse 82, resp. rate 18, last menstrual period 06/05/2011, SpO2 98 %.  General: Alert, interactive, in no acute distress. Neck: Supple without lymphadenopathy. Lungs: Clear to auscultation without wheezing, rhonchi or rales. CV: Normal S1, S2 without murmurs. Skin: Warm and dry, without lesions or rashes.  The following portions of the patient's history were reviewed and updated as appropriate: allergies, current medications, past family history, past medical history, past  social history, past surgical history and problem list.  Allergies as of 07/07/2017      Reactions   Adhesive [tape] Rash   The adhesive, not the tape is the source of the problem.   Erythromycin    nausea      Medication List        Accurate as of 07/07/17 10:18 AM. Always use your most recent med list.          AMBULATORY NON FORMULARY MEDICATION Take 1.5 oz by mouth daily. Medication Name: Shea Evans mangosteen juice   B-complex with vitamin C tablet Take 1 tablet by mouth daily.   CALCIUM-MAGNESIUM PO Take by mouth 3 (three) times daily.   CHERRY PO Take 500 mg by mouth daily.   Fish Oil 1200 MG Caps Take 1,200 mg by mouth daily.   magnesium 30 MG tablet Take 30 mg by mouth 2 (two) times daily.   nystatin cream Commonly known as:  MYCOSTATIN   VITAMIN D PO Take 2,000 Units/day by mouth daily.   VITAMIN E COMPLEX PO Take 200 Units by mouth daily.       Allergies  Allergen Reactions  . Adhesive [Tape] Rash    The adhesive, not the tape is the source of the problem.  . Erythromycin     nausea    I appreciate the opportunity to take part in Sally Le's care. Please do not hesitate to contact me with questions.  Sincerely,   R. Edgar Frisk, MD

## 2017-10-20 ENCOUNTER — Telehealth: Payer: Self-pay

## 2017-10-20 NOTE — Telephone Encounter (Signed)
Spoke to patient advised of plan patient will call our clinic to schedule appt

## 2017-10-20 NOTE — Telephone Encounter (Signed)
Patient called concern about a reaction she had after taking amoxicillin for a dental procedure. She stated she had redness and patchy spots and was just concern it was due to the medication. She was advise to avoid medication at this time. She is having another dental procedure and will avoid medication and will contact the office regarding her rash and see if it is Amoxicillin. Patient will like to do a challenge if possible

## 2017-10-20 NOTE — Telephone Encounter (Signed)
We can skin test to penicillin and challenge if negative.

## 2018-01-11 ENCOUNTER — Ambulatory Visit: Payer: BLUE CROSS/BLUE SHIELD | Admitting: Allergy and Immunology

## 2018-01-11 ENCOUNTER — Encounter: Payer: Self-pay | Admitting: Allergy and Immunology

## 2018-01-11 VITALS — BP 128/76 | HR 68 | Resp 16

## 2018-01-11 DIAGNOSIS — Z889 Allergy status to unspecified drugs, medicaments and biological substances status: Secondary | ICD-10-CM | POA: Diagnosis not present

## 2018-01-11 DIAGNOSIS — L500 Allergic urticaria: Secondary | ICD-10-CM | POA: Diagnosis not present

## 2018-01-11 NOTE — Assessment & Plan Note (Signed)
Sally Le had negative skin tests and was able to tolerate the open graded oral challenge today without adverse signs or symptoms. Therefore, she has the same risk of systemic reaction associated with penicillin as the general population. She has been asked to contact me if any untoward symptoms arise over the next 48 hours.

## 2018-01-11 NOTE — Patient Instructions (Addendum)
History of drug allergy Sally Le had negative skin tests and was able to tolerate the open graded oral challenge today without adverse signs or symptoms. Therefore, she has the same risk of systemic reaction associated with penicillin as the general population. She has been asked to contact me if any untoward symptoms arise over the next 48 hours.

## 2018-01-11 NOTE — Progress Notes (Signed)
Follow-up Note  RE: Sally Le MRN: [id number] DOB: [birth year] Date of Office Visit: 01/11/2018  Primary care provider: Marda Stalker, PA-C Referring provider: Marda Stalker, PA-C  History of present illness: Sally Le is a 62 y.o. female presenting today for evaluation of possible penicillin allergy.  She reports that in August she had a root canal requiring a 7 or 10-day course of amoxicillin.  The day after having completed the course she noted a small "pink splotch" on her neck and shoulder.  Over the next day or 2, she developed a "measley" rash on her lower extremities and the tops of her feet.  The rash was mildly pruritic on the tops of her feet, otherwise was neither pruritic or painful.  She notes that she had been out in the hot weather quite a bit that week and she was wearing compression hose.  She did not experience concomitant angioedema, cardiopulmonary symptoms, or GI symptoms.  There were no vesicles or sloughing of the skin.  She was also taking a pain medication and acetaminophen at the time.  Assessment and plan: History of drug allergy Sally Le had negative skin tests and was able to tolerate the open graded oral challenge today without adverse signs or symptoms. Therefore, she has the same risk of systemic reaction associated with penicillin as the general population. She has been asked to contact me if any untoward symptoms arise over the next 48 hours.   Diagnostics: Epicutaneous Pre-Pen testing: Negative despite a positive histamine control. Epicutaneous pen G testing: Negative despite a positive histamine control. Intradermal testing: Negative. Open graded penicillin challenge: The patient was able to tolerate the challenge today without adverse signs or symptoms. Vital signs were stable throughout the challenge and observation period.     Physical examination: Blood pressure 128/76, pulse 68, resp. rate 16, last menstrual period  06/05/2011.  General: Alert, interactive, in no acute distress. Neck: Supple without lymphadenopathy. Lungs: Clear to auscultation without wheezing, rhonchi or rales. CV: Normal S1, S2 without murmurs. Skin: Warm and dry, without lesions or rashes.  The following portions of the patient's history were reviewed and updated as appropriate: allergies, current medications, past family history, past medical history, past social history, past surgical history and problem list.  Allergies as of 01/11/2018      Reactions   Adhesive [tape] Rash   The adhesive, not the tape is the source of the problem.   Erythromycin    nausea      Medication List        Accurate as of 01/11/18 12:58 PM. Always use your most recent med list.          AMBULATORY NON FORMULARY MEDICATION Take 1.5 oz by mouth daily. Medication Name: Sally Le mangosteen juice   B-complex with vitamin C tablet Take 1 tablet by mouth daily.   CALCIUM-MAGNESIUM PO Take by mouth 3 (three) times daily.   CHERRY PO Take 500 mg by mouth daily.   Fish Oil 1200 MG Caps Take 1,200 mg by mouth daily.   magnesium 30 MG tablet Take 30 mg by mouth 2 (two) times daily.   nystatin cream Commonly known as:  MYCOSTATIN   VITAMIN D PO Take 2,000 Units/day by mouth daily.   VITAMIN E COMPLEX PO Take 200 Units by mouth daily.       Allergies  Allergen Reactions  . Adhesive [Tape] Rash    The adhesive, not the tape is the source of the problem.  . Erythromycin  nausea   Review of systems: Review of systems negative except as noted in HPI / PMHx or noted below: Constitutional: Negative.  HENT: Negative.   Eyes: Negative.  Respiratory: Negative.   Cardiovascular: Negative.  Gastrointestinal: Negative.  Genitourinary: Negative.  Musculoskeletal: Negative.  Neurological: Negative.  Endo/Heme/Allergies: Negative.  Cutaneous: Negative.  Past Medical History:  Diagnosis Date  . History of seasonal allergies   .  Nephrolithiasis 1984  . 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 Urticaria   . Venous insufficiency   . Vitamin D deficiency     Family History  Problem Relation Age of Onset  . Parkinson's disease Mother   . Breast cancer Mother   . Cancer Mother 23       Estrogen and progestin positive breast cancer  . Cancer Father        oat cell lung CA with mets to brain  . Pancreatic cancer Maternal Grandfather   . Cancer Maternal Grandfather        pancreatic  . Obesity Sister   . Spondylolisthesis Sister   . Asthma Sister   . Cancer Maternal Aunt 86       lung cancer (non-smoker)  . Heart disease Paternal Grandmother   . Lung cancer Unknown     Social History   Socioeconomic History  . Marital status: Married    Spouse name: Sally Le  . Number of children: 0  . Years of education: 27  . Highest education level: Not on file  Occupational History  . Occupation: Product manager: [employer]  . Financial resource strain: Not on file  . Food insecurity:    Worry: Not on file    Inability: Not on file  . Transportation needs:    Medical: Not on file    Non-medical: Not on file  Tobacco Use  . Smoking status: Never Smoker  . Smokeless tobacco: Never Used  Substance and Sexual Activity  . Alcohol use: Yes    Alcohol/week: 1.0 standard drinks    Types: 1 Glasses of wine per week  . Drug use: No  . Sexual activity: Yes    Partners: Male    Birth control/protection: Post-menopausal  Lifestyle  . Physical activity:    Days per week: Not on file    Minutes per session: Not on file  . Stress: Not on file  Relationships  . Social connections:    Talks on phone: Not on file    Gets together: Not on file    Attends religious service: Not on file    Active member of club or organization: Not on file    Attends meetings of clubs or organizations: Not on file    Relationship status: Not on file    . Intimate partner violence:    Fear of current or ex partner: Not on file    Emotionally abused: Not on file    Physically abused: Not on file    Forced sexual activity: Not on file  Other Topics Concern  . Not on file  Social History Narrative   Exercises 4xweekly.  Lives with her husband.    I appreciate the opportunity to take part in Sally Le's care. Please do not hesitate to contact me with questions.  Sincerely,   R. Edgar Frisk, MD

## 2019-03-11 ENCOUNTER — Ambulatory Visit: Payer: PPO | Attending: Internal Medicine

## 2019-03-11 DIAGNOSIS — Z23 Encounter for immunization: Secondary | ICD-10-CM | POA: Insufficient documentation

## 2019-03-11 NOTE — Progress Notes (Signed)
   Covid-19 Vaccination Clinic  Name:  Sally Le    MRN: [id number] DOB: Jun 21, 1955  03/11/2019  [name] was observed post Covid-19 immunization for 15 minutes without incidence. She was provided with Vaccine Information Sheet and instruction to access the V-Safe system.   [name] was instructed to call 911 with any severe reactions post vaccine: Marland Kitchen Difficulty breathing  . Swelling of your face and throat  . A fast heartbeat  . A bad rash all over your body  . Dizziness and weakness    Immunizations Administered    Name Date Dose VIS Date Route   Pfizer COVID-19 Vaccine 03/11/2019  4:25 PM 0.3 mL 01/28/2019 Intramuscular   Manufacturer: [place]   Lot: [id number]   [place]: [id number]

## 2019-04-01 ENCOUNTER — Ambulatory Visit: Payer: PPO | Attending: Internal Medicine

## 2019-04-01 DIAGNOSIS — Z23 Encounter for immunization: Secondary | ICD-10-CM

## 2019-04-01 NOTE — Progress Notes (Signed)
   Covid-19 Vaccination Clinic  Name:  Sally Le    MRN: [id number] DOB: [birth year]  04/01/2019  [name] was observed post Covid-19 immunization for 15 minutes without incidence. She was provided with Vaccine Information Sheet and instruction to access the V-Safe system.   [name] was instructed to call 911 with any severe reactions post vaccine: Marland Kitchen Difficulty breathing  . Swelling of your face and throat  . A fast heartbeat  . A bad rash all over your body  . Dizziness and weakness    Immunizations Administered    Name Date Dose VIS Date Route   Pfizer COVID-19 Vaccine 04/01/2019  8:14 AM 0.3 mL 01/28/2019 Intramuscular   Manufacturer: [place]   Lot: [id number]   [place]: [id number]

## 2019-06-06 DIAGNOSIS — Z1231 Encounter for screening mammogram for malignant neoplasm of breast: Secondary | ICD-10-CM | POA: Diagnosis not present

## 2019-06-06 DIAGNOSIS — Z01419 Encounter for gynecological examination (general) (routine) without abnormal findings: Secondary | ICD-10-CM | POA: Diagnosis not present

## 2019-06-06 DIAGNOSIS — Z6829 Body mass index (BMI) 29.0-29.9, adult: Secondary | ICD-10-CM | POA: Diagnosis not present

## 2019-09-02 DIAGNOSIS — Z0000 Encounter for general adult medical examination without abnormal findings: Secondary | ICD-10-CM | POA: Diagnosis not present

## 2019-09-02 DIAGNOSIS — E559 Vitamin D deficiency, unspecified: Secondary | ICD-10-CM | POA: Diagnosis not present

## 2019-09-02 DIAGNOSIS — Z136 Encounter for screening for cardiovascular disorders: Secondary | ICD-10-CM | POA: Diagnosis not present

## 2019-09-06 DIAGNOSIS — Z1211 Encounter for screening for malignant neoplasm of colon: Secondary | ICD-10-CM | POA: Diagnosis not present

## 2019-09-06 DIAGNOSIS — E559 Vitamin D deficiency, unspecified: Secondary | ICD-10-CM | POA: Diagnosis not present

## 2019-09-06 DIAGNOSIS — Z0000 Encounter for general adult medical examination without abnormal findings: Secondary | ICD-10-CM | POA: Diagnosis not present

## 2019-09-06 DIAGNOSIS — N1831 Chronic kidney disease, stage 3a: Secondary | ICD-10-CM | POA: Diagnosis not present

## 2019-10-11 DIAGNOSIS — Z808 Family history of malignant neoplasm of other organs or systems: Secondary | ICD-10-CM | POA: Diagnosis not present

## 2019-10-11 DIAGNOSIS — L84 Corns and callosities: Secondary | ICD-10-CM | POA: Diagnosis not present

## 2019-10-11 DIAGNOSIS — L570 Actinic keratosis: Secondary | ICD-10-CM | POA: Diagnosis not present

## 2019-10-11 DIAGNOSIS — L821 Other seborrheic keratosis: Secondary | ICD-10-CM | POA: Diagnosis not present

## 2019-10-11 DIAGNOSIS — L578 Other skin changes due to chronic exposure to nonionizing radiation: Secondary | ICD-10-CM | POA: Diagnosis not present

## 2019-10-11 DIAGNOSIS — Z85828 Personal history of other malignant neoplasm of skin: Secondary | ICD-10-CM | POA: Diagnosis not present

## 2019-10-27 DIAGNOSIS — H608X3 Other otitis externa, bilateral: Secondary | ICD-10-CM | POA: Diagnosis not present

## 2020-09-05 DIAGNOSIS — Z6830 Body mass index (BMI) 30.0-30.9, adult: Secondary | ICD-10-CM | POA: Diagnosis not present

## 2020-09-05 DIAGNOSIS — Z1231 Encounter for screening mammogram for malignant neoplasm of breast: Secondary | ICD-10-CM | POA: Diagnosis not present

## 2020-09-05 DIAGNOSIS — Z779 Other contact with and (suspected) exposures hazardous to health: Secondary | ICD-10-CM | POA: Diagnosis not present

## 2020-10-03 DIAGNOSIS — R87619 Unspecified abnormal cytological findings in specimens from cervix uteri: Secondary | ICD-10-CM | POA: Diagnosis not present

## 2020-10-03 DIAGNOSIS — N870 Mild cervical dysplasia: Secondary | ICD-10-CM | POA: Diagnosis not present

## 2020-10-11 DIAGNOSIS — Z808 Family history of malignant neoplasm of other organs or systems: Secondary | ICD-10-CM | POA: Diagnosis not present

## 2020-10-11 DIAGNOSIS — D225 Melanocytic nevi of trunk: Secondary | ICD-10-CM | POA: Diagnosis not present

## 2020-10-11 DIAGNOSIS — L720 Epidermal cyst: Secondary | ICD-10-CM | POA: Diagnosis not present

## 2020-10-11 DIAGNOSIS — L578 Other skin changes due to chronic exposure to nonionizing radiation: Secondary | ICD-10-CM | POA: Diagnosis not present

## 2020-10-11 DIAGNOSIS — L821 Other seborrheic keratosis: Secondary | ICD-10-CM | POA: Diagnosis not present

## 2020-10-11 DIAGNOSIS — L570 Actinic keratosis: Secondary | ICD-10-CM | POA: Diagnosis not present

## 2020-10-11 DIAGNOSIS — Z800 Family history of malignant neoplasm of digestive organs: Secondary | ICD-10-CM | POA: Diagnosis not present

## 2020-10-11 DIAGNOSIS — Z85828 Personal history of other malignant neoplasm of skin: Secondary | ICD-10-CM | POA: Diagnosis not present

## 2020-11-14 DIAGNOSIS — R87612 Low grade squamous intraepithelial lesion on cytologic smear of cervix (LGSIL): Secondary | ICD-10-CM | POA: Diagnosis not present

## 2020-12-20 DIAGNOSIS — H608X3 Other otitis externa, bilateral: Secondary | ICD-10-CM | POA: Diagnosis not present

## 2020-12-28 ENCOUNTER — Ambulatory Visit: Payer: PPO

## 2021-01-16 DIAGNOSIS — H903 Sensorineural hearing loss, bilateral: Secondary | ICD-10-CM | POA: Diagnosis not present

## 2021-06-14 ENCOUNTER — Other Ambulatory Visit (HOSPITAL_BASED_OUTPATIENT_CLINIC_OR_DEPARTMENT_OTHER): Payer: Self-pay

## 2021-06-14 ENCOUNTER — Ambulatory Visit: Payer: PPO | Attending: Internal Medicine

## 2021-06-14 DIAGNOSIS — Z23 Encounter for immunization: Secondary | ICD-10-CM

## 2021-06-14 MED ORDER — PFIZER COVID-19 VAC BIVALENT 30 MCG/0.3ML IM SUSP
INTRAMUSCULAR | 0 refills | Status: DC
  Filled 2021-06-14: qty 0.3, 1d supply, fill #0

## 2021-06-14 NOTE — Progress Notes (Signed)
? ?  Covid-19 Vaccination Clinic ? ?Name:  Sally Le    ?MRN: [id number] ?DOB: [birth year] ? ?06/14/2021 ? ?[name] was observed post Covid-19 immunization for 15 minutes without incident. She was provided with Vaccine Information Sheet and instruction to access the V-Safe system.  ? ?[name] was instructed to call 911 with any severe reactions post vaccine: ?Difficulty breathing  ?Swelling of face and throat  ?A fast heartbeat  ?A bad rash all over body  ?Dizziness and weakness  ? ?Immunizations Administered   ? ? Name Date Dose VIS Date Route  ? Ambulance person Booster 06/14/2021 11:34 AM 0.3 mL 10/17/2020 Intramuscular  ? Manufacturer: [place]: [id number]  ? [place]: [phone]  ? ?  ? ?

## 2021-12-05 ENCOUNTER — Other Ambulatory Visit (HOSPITAL_COMMUNITY): Payer: Self-pay

## 2021-12-13 ENCOUNTER — Other Ambulatory Visit (HOSPITAL_BASED_OUTPATIENT_CLINIC_OR_DEPARTMENT_OTHER): Payer: Self-pay

## 2021-12-13 MED ORDER — COMIRNATY 30 MCG/0.3ML IM SUSY
PREFILLED_SYRINGE | INTRAMUSCULAR | 0 refills | Status: DC
  Filled 2021-12-13: qty 0.3, 1d supply, fill #0

## 2022-06-13 ENCOUNTER — Other Ambulatory Visit (HOSPITAL_BASED_OUTPATIENT_CLINIC_OR_DEPARTMENT_OTHER): Payer: Self-pay

## 2022-06-13 MED ORDER — COMIRNATY 30 MCG/0.3ML IM SUSY
0.3000 mL | PREFILLED_SYRINGE | INTRAMUSCULAR | 0 refills | Status: DC
  Filled 2022-06-13: qty 0.3, 1d supply, fill #0

## 2022-11-25 ENCOUNTER — Other Ambulatory Visit (HOSPITAL_COMMUNITY): Payer: Self-pay

## 2023-01-19 ENCOUNTER — Other Ambulatory Visit (HOSPITAL_COMMUNITY): Payer: Self-pay

## 2023-06-01 ENCOUNTER — Encounter: Payer: Self-pay | Admitting: Family Medicine

## 2023-06-01 ENCOUNTER — Ambulatory Visit (INDEPENDENT_AMBULATORY_CARE_PROVIDER_SITE_OTHER): Payer: PPO | Admitting: Family Medicine

## 2023-06-01 VITALS — BP 128/80 | HR 71 | Temp 97.7°F | Ht 65.0 in | Wt 164.0 lb

## 2023-06-01 DIAGNOSIS — L219 Seborrheic dermatitis, unspecified: Secondary | ICD-10-CM

## 2023-06-01 DIAGNOSIS — L500 Allergic urticaria: Secondary | ICD-10-CM

## 2023-06-01 DIAGNOSIS — Z79899 Other long term (current) drug therapy: Secondary | ICD-10-CM | POA: Insufficient documentation

## 2023-06-01 DIAGNOSIS — Z889 Allergy status to unspecified drugs, medicaments and biological substances status: Secondary | ICD-10-CM | POA: Diagnosis not present

## 2023-06-01 DIAGNOSIS — Z136 Encounter for screening for cardiovascular disorders: Secondary | ICD-10-CM

## 2023-06-01 DIAGNOSIS — Z1211 Encounter for screening for malignant neoplasm of colon: Secondary | ICD-10-CM

## 2023-06-01 DIAGNOSIS — E538 Deficiency of other specified B group vitamins: Secondary | ICD-10-CM | POA: Insufficient documentation

## 2023-06-01 LAB — COMPREHENSIVE METABOLIC PANEL WITH GFR
ALT: 18 U/L (ref 0–35)
AST: 25 U/L (ref 0–37)
Albumin: 4.6 g/dL (ref 3.5–5.2)
Alkaline Phosphatase: 79 U/L (ref 39–117)
BUN: 21 mg/dL (ref 6–23)
CO2: 28 meq/L (ref 19–32)
Calcium: 9.9 mg/dL (ref 8.4–10.5)
Chloride: 105 meq/L (ref 96–112)
Creatinine, Ser: 0.99 mg/dL (ref 0.40–1.20)
GFR: 58.26 mL/min — ABNORMAL LOW (ref >60.00)
Glucose, Bld: 102 mg/dL — ABNORMAL HIGH (ref 70–99)
Potassium: 4.6 meq/L (ref 3.5–5.1)
Sodium: 141 meq/L (ref 135–145)
Total Bilirubin: 0.6 mg/dL (ref 0.2–1.2)
Total Protein: 7.6 g/dL (ref 6.0–8.3)

## 2023-06-01 LAB — CBC WITH DIFFERENTIAL/PLATELET
Basophils Absolute: 0 10*3/uL (ref 0.0–0.1)
Basophils Relative: 0.9 % (ref 0.0–3.0)
Eosinophils Absolute: 0 10*3/uL (ref 0.0–0.7)
Eosinophils Relative: 1.1 % (ref 0.0–5.0)
HCT: 41.3 % (ref 36.0–46.0)
Hemoglobin: 13.9 g/dL (ref 12.0–15.0)
Lymphocytes Relative: 26 % (ref 12.0–46.0)
Lymphs Abs: 1.1 10*3/uL (ref 0.7–4.0)
MCHC: 33.7 g/dL (ref 30.0–36.0)
MCV: 98.4 fl (ref 78.0–100.0)
Monocytes Absolute: 0.4 10*3/uL (ref 0.1–1.0)
Monocytes Relative: 10.1 % (ref 3.0–12.0)
Neutro Abs: 2.6 10*3/uL (ref 1.4–7.7)
Neutrophils Relative %: 61.9 % (ref 43.0–77.0)
Platelets: 199 10*3/uL (ref 150.0–400.0)
RBC: 4.19 Mil/uL (ref 3.87–5.11)
RDW: 14 % (ref 11.5–15.5)
WBC: 4.3 10*3/uL (ref 4.0–10.5)

## 2023-06-01 LAB — LIPID PANEL
Cholesterol: 217 mg/dL — ABNORMAL HIGH (ref 0–200)
HDL: 93.3 mg/dL (ref >39.00)
LDL Cholesterol: 106 mg/dL — ABNORMAL HIGH (ref 0–99)
NonHDL: 123.9
Total CHOL/HDL Ratio: 2
Triglycerides: 91 mg/dL (ref 0.0–149.0)
VLDL: 18.2 mg/dL (ref 0.0–40.0)

## 2023-06-01 LAB — VITAMIN B12: Vitamin B-12: 691 pg/mL (ref 211–911)

## 2023-06-01 LAB — VITAMIN D 25 HYDROXY (VIT D DEFICIENCY, FRACTURES): VITD: 49.66 ng/mL (ref 30.00–100.00)

## 2023-06-01 NOTE — Assessment & Plan Note (Signed)
 Checking B12 today, continue B complex supplementation

## 2023-06-01 NOTE — Assessment & Plan Note (Addendum)
 Checking lipids today, fasting Also checking MMR titer given age group, will recommend vaccine as needed

## 2023-06-01 NOTE — Assessment & Plan Note (Signed)
 Continue to keep antihistamines on hand as needed

## 2023-06-01 NOTE — Assessment & Plan Note (Signed)
 Continue to see Dr. Margurette Shillings as scheduled

## 2023-06-01 NOTE — Progress Notes (Signed)
 New Patient Office Visit  Subjective    Patient ID: Sally Le, female    DOB: 09/08/1955  Age: 68 y.o. MRN: [id number]  CC:  Chief Complaint  Patient presents with   Establish Care    HPI Sally Le presents to establish care today. She was a previous patient of mine at [facility]. She is due for pneumovax, will get at the pharmacy. Due for colon cancer screening, will order Cologuard today. Receives regular dental and eye care.  Reports eating well, sleeping well, feeling well overall.  She is fasting today. Reports compliance with medication regimen.  Reports left deltoid/shoulder pain at times after flu vaccine last fall.  States that she feels that the vaccine was given too high up on the arm and that this was what was causing her pain.  Has been doing classical stretch for treatment with benefit. Inquiring about MMR vaccine, unsure if she has had one in the past. Denies other concerns today. Medical hx as outlined below.    Outpatient Encounter Medications as of 06/01/2023  Medication Sig   AMBULATORY NON FORMULARY MEDICATION Take 1.5 oz by mouth daily. Medication Name: Xango mangosteen juice   B Complex-C (B-COMPLEX WITH VITAMIN C) tablet Take 1 tablet by mouth daily.   CALCIUM-MAGNESIUM PO Take by mouth 3 (three) times daily.   CHERRY PO Take 500 mg by mouth daily.   Cholecalciferol (VITAMIN D PO) Take 2,000 Units/day by mouth daily.   Elderberry-Vitamin C-Zinc (ELDERBERRY EXTRACT PO) Take by mouth.   LUTEIN PO Take by mouth.   magnesium 30 MG tablet Take 30 mg by mouth 2 (two) times daily.   Menaquinone-7 (VITAMIN K2 PO) Take by mouth. Vitamin K2-7   nystatin cream (MYCOSTATIN)    Omega-3 Fatty Acids (FISH OIL) 1200 MG CAPS Take 1,200 mg by mouth daily.   UNABLE TO FIND Med Name: Vein Formula   [DISCONTINUED] CELERY, DIAGNOSTIC, IJ Take 2 capsules by mouth daily. (Patient not taking: Reported on 06/01/2023)   [DISCONTINUED] COVID-19 mRNA  bivalent vaccine, Pfizer, (PFIZER COVID-19 VAC BIVALENT) injection Inject into the muscle. (Patient not taking: Reported on 06/01/2023)   [DISCONTINUED] COVID-19 mRNA vaccine 2023-2024 (COMIRNATY) syringe Inject into the muscle. (Patient not taking: Reported on 06/01/2023)   [DISCONTINUED] COVID-19 mRNA vaccine 2023-2024 (COMIRNATY) syringe Inject 0.3 mLs into the muscle. (Patient not taking: Reported on 06/01/2023)   [DISCONTINUED] VITAMIN E COMPLEX PO Take 200 Units by mouth daily. (Patient not taking: Reported on 06/01/2023)   No facility-administered encounter medications on file as of 06/01/2023.    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, pre-basal cell RIGHT cheek   Urticaria    Venous insufficiency    Vitamin D deficiency     Past Surgical History:  Procedure Laterality Date   ADENOIDECTOMY     APPENDECTOMY     DERMOID CYST  EXCISION     TONSILLECTOMY     TONSILLECTOMY      Family History  Problem Relation Age of Onset   Parkinson's disease Mother    Breast cancer Mother    Cancer Mother 54       Estrogen and progestin positive breast cancer   Cancer Father        oat cell lung CA with mets to brain   Pancreatic cancer Maternal Grandfather    Cancer Maternal Grandfather  pancreatic   Obesity Sister    Spondylolisthesis Sister    Asthma Sister    Cancer Maternal Aunt 90       lung cancer (non-smoker)   Heart disease Paternal Grandmother    Lung cancer Unknown     Social History   Socioeconomic History   Marital status: Married    Spouse name: Daphine Deutscher   Number of children: 0   Years of education: 15   Highest education level: Not on file  Occupational History   Occupation: Magazine features editor: First Theatre manager For [employer]  Tobacco Use   Smoking status: Never   Smokeless tobacco: Never  Vaping Use   Vaping status: Never Used  Substance  and Sexual Activity   Alcohol use: Yes    Alcohol/week: 1.0 standard drink of alcohol    Types: 1 Glasses of wine per week   Drug use: No   Sexual activity: Yes    Partners: Male    Birth control/protection: Post-menopausal  Other Topics Concern   Not on file  Social History Narrative   Exercises 4xweekly.  Lives with her husband.   Social Drivers of Corporate investment banker Strain: Not on file  Food Insecurity: Low Risk  (12/26/2022)   Received from [facility]   Hunger Vital Sign    Worried About Running Out of Food in the Last Year: Never true    Ran Out of Food in the Last Year: Never true  Transportation Needs: No Transportation Needs (12/26/2022)   Received from [facility]    In the past 12 months, has lack of reliable transportation kept you from medical appointments, meetings, work or from getting things needed for daily living? : No  Physical Activity: Not on file  Stress: Not on file  Social Connections: Not on file  Intimate Partner Violence: Not on file    ROS Per HPI      Objective    BP 128/80   Pulse 71   Temp 97.7 F (36.5 C) (Temporal)   Ht 5\' 5"  (1.651 m)   Wt 164 lb (74.4 kg)   LMP 06/05/2011   SpO2 98%   BMI 27.29 kg/m   Physical Exam Vitals and nursing note reviewed.  Constitutional:      General: She is not in acute distress.    Appearance: Normal appearance. She is normal weight.  HENT:     Head: Normocephalic and atraumatic.     Right Ear: External ear normal.     Left Ear: External ear normal.     Nose: Nose normal.     Mouth/Throat:     Mouth: Mucous membranes are moist.     Pharynx: Oropharynx is clear.  Eyes:     Extraocular Movements: Extraocular movements intact.     Pupils: Pupils are equal, round, and reactive to light.  Neck:     Vascular: No carotid bruit.  Cardiovascular:     Rate and Rhythm: Normal rate and regular rhythm.     Pulses: Normal pulses.     Heart sounds: Normal heart sounds.   Pulmonary:     Effort: Pulmonary effort is normal. No respiratory distress.     Breath sounds: Normal breath sounds. No wheezing, rhonchi or rales.  Musculoskeletal:        General: Normal range of motion.     Cervical back: Normal range of motion.     Right lower leg: No edema.  Left lower leg: No edema.  Lymphadenopathy:     Cervical: No cervical adenopathy.  Neurological:     General: No focal deficit present.     Mental Status: She is alert and oriented to person, place, and time.  Psychiatric:        Mood and Affect: Mood normal.        Thought Content: Thought content normal.         Assessment & Plan:   Colon cancer screening Assessment & Plan: Checking lipids today, fasting Also checking MMR titer given age group, will recommend vaccine as needed  Orders: -     Cologuard  Seborrheic dermatitis Assessment & Plan: Continue to see Dr. Margurette Shillings as scheduled   History of drug allergy Assessment & Plan: Continue to keep antihistamines on hand as needed   Allergic urticaria Assessment & Plan: Continue OTC antihistamines as needed   Medication management Assessment & Plan: Checking lipids today, fasting Also checking MMR titer given age group, will recommend vaccine as needed  Orders: -     CBC with Differential/Platelet -     Comprehensive metabolic panel with GFR -     Lipid panel -     VITAMIN D 25 Hydroxy (Vit-D Deficiency, Fractures) -     Vitamin B12 -     Measles/Mumps/Rubella Immunity  Encounter for screening for cardiovascular disorders Assessment & Plan: Checking lipids today, fasting Also checking MMR titer given age group, will recommend vaccine as needed  Orders: -     Lipid panel  Vitamin B12 deficiency Assessment & Plan: Checking B12 today, continue B complex supplementation  Orders: -     Vitamin B12     Return in about 1 year (around 05/31/2024) for med check, labs.   Wellington Half, FNP

## 2023-06-01 NOTE — Patient Instructions (Addendum)
 Welcome to [facility]!  Thank you for choosing us  for your Primary Care needs.   We offer in person and video appointments for your convenience. You may call our office to schedule appointments, or you may schedule appointments with me through MyChart.   The best way to get in contact with me is via MyChart message. This will get to me faster than a phone call, unless there is an emergency, then please call 911.  The lab is located downstairs in the Sports Medicine building, we also have xray available there.   We are checking labs today, will be in contact with any results that require further attention.  Declines AWV.

## 2023-06-01 NOTE — Assessment & Plan Note (Signed)
 Continue OTC antihistamines as needed

## 2023-06-02 LAB — MEASLES/MUMPS/RUBELLA IMMUNITY
Mumps IgG: >300 [AU]/ml
Rubella: >33 {index}
Rubeola IgG: 25.3 [AU]/ml

## 2023-06-15 LAB — COLOGUARD: COLOGUARD: NEGATIVE

## 2023-06-22 ENCOUNTER — Telehealth (INDEPENDENT_AMBULATORY_CARE_PROVIDER_SITE_OTHER): Payer: Self-pay

## 2023-06-22 ENCOUNTER — Ambulatory Visit (INDEPENDENT_AMBULATORY_CARE_PROVIDER_SITE_OTHER): Payer: PPO | Admitting: Otolaryngology

## 2023-06-22 ENCOUNTER — Encounter (INDEPENDENT_AMBULATORY_CARE_PROVIDER_SITE_OTHER): Payer: Self-pay

## 2023-06-22 VITALS — BP 166/82 | HR 67 | Ht 64.5 in | Wt 164.0 lb

## 2023-06-22 DIAGNOSIS — H903 Sensorineural hearing loss, bilateral: Secondary | ICD-10-CM | POA: Diagnosis not present

## 2023-06-22 DIAGNOSIS — H6123 Impacted cerumen, bilateral: Secondary | ICD-10-CM

## 2023-06-22 DIAGNOSIS — H608X3 Other otitis externa, bilateral: Secondary | ICD-10-CM

## 2023-06-22 MED ORDER — FLUOCINOLONE ACETONIDE 0.01 % OT OIL: 3.0000 [drp] | TOPICAL_OIL | OTIC | 5 refills | Status: AC

## 2023-06-22 NOTE — Telephone Encounter (Signed)
 Informed patient that r/x was sent in by Dr. Lydia Sams. Patient verbalizes understanding.

## 2023-06-22 NOTE — Progress Notes (Signed)
 Dear Sally Le, Here is my assessment for our mutual patient, Sally Le. Thank you for allowing me the opportunity to care for your patient. Please do not hesitate to contact me should you have any other questions. Sincerely, Dr. Milon Le  Otolaryngology Clinic Note Referring provider: Dr. Jodelle Le HPI:  Sally Le is a 68 y.o. Le kindly referred by Sally Le for evaluation of bilateral eczematoid otitis externa.   Initial visit (05/2023): Patient reports: she has had chronic bilateral itching of the ears and has been following with Sally Le for several years with benefit from trimcinolone and nystatin cream. Unfortunately, given the commute, wished to see an ENT in [place]. She is otherwise doing well except for intermittent ear itching. She was prescribed dermOtic last visit but has not used it. Hearing is also stable Patient denies: ear pain, fullness, vertigo, drainage Patient additionally denies: deep pain in ear canal, eustachian tube symptoms such as popping/crackling, sensitive to pressure changes Patient also denies barotrauma, vestibular suppressant use, ototoxic medication use Prior ear surgery: no  Personal or FHx of bleeding dz or anesthesia difficulty: no  Independent Review of Additional Tests or Records:  Sally Le (12/26/2022) notes: noted bilateral Eczematoid Otitis externa - itching and chronic otitis; seen by Sally Le since 2021. 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, pre-basal cell RIGHT cheek   Urticaria    Venous insufficiency    Vitamin D  deficiency      Past Surgical History:  Procedure Laterality Date   ADENOIDECTOMY     APPENDECTOMY     DERMOID CYST  EXCISION     TONSILLECTOMY     TONSILLECTOMY      Family History  Problem Relation Age of Onset   Parkinson's disease Mother    Breast cancer Mother    Cancer Mother 46       Estrogen and progestin positive breast cancer   Cancer Father        oat cell lung CA with mets to brain   Pancreatic cancer Maternal Grandfather    Cancer Maternal Grandfather        pancreatic   Obesity Sister    Spondylolisthesis Sister    Asthma Sister    Cancer Maternal Aunt 19       lung cancer (non-smoker)   Heart disease Paternal Grandmother    Lung cancer Unknown      Social Connections: Not on file      Current Outpatient Medications:    AMBULATORY NON FORMULARY MEDICATION, Take 1.5 oz by mouth daily. Medication Name: Xango mangosteen juice, Disp: , Rfl:    B Complex-C (B-COMPLEX WITH VITAMIN C) tablet, Take 1 tablet by mouth daily., Disp: , Rfl:    CALCIUM-MAGNESIUM PO, Take by mouth 3 (three) times daily., Disp: , Rfl:    CHERRY PO, Take 500 mg by mouth daily., Disp: , Rfl:    Cholecalciferol (VITAMIN D  PO), Take 2,000 Units/day by mouth daily., Disp: , Rfl:    Elderberry-Vitamin C-Zinc (ELDERBERRY  EXTRACT PO), Take by mouth., Disp: , Rfl:    LUTEIN PO, Take by mouth., Disp: , Rfl:    magnesium 30 MG tablet, Take 30 mg by mouth 2 (two) times daily., Disp: , Rfl:    Menaquinone-7 (VITAMIN K2 PO), Take by mouth. Vitamin K2-7, Disp: , Rfl:    nystatin cream (MYCOSTATIN), , Disp: , Rfl: 0   nystatin-triamcinolone ointment (MYCOLOG), Apply 1 Application topically 2 (two) times daily., Disp: , Rfl:    Omega-3 Fatty Acids (FISH OIL) 1200 MG CAPS, Take 1,200 mg by mouth daily., Disp: , Rfl:    UNABLE TO FIND, Med Name: Vein Formula, Disp: , Rfl:    Physical Exam:   BP (!)  166/82 (BP Location: Right Arm, Patient Position: Sitting, Cuff Size: Normal)   Pulse 67   Ht 5' 4.5" (1.638 m)   Wt 164 lb (74.4 kg)   LMP 06/05/2011   SpO2 97%   BMI 27.72 kg/m   Salient findings:  CN II-XII intact Given history and complaints, ear microscopy was indicated and performed for evaluation with findings as below in physical exam section and in procedures; bilateral impacted ceruminous debris EAC; after clearance, Bilateral EAC clear and TM intact with well pneumatized middle ear spaces Weber 512: mid Rinne 512: AC > BC b/l  No lesions of oral cavity/oropharynx; dentition good No obviously palpable neck masses/lymphadenopathy/thyromegaly No respiratory distress or stridor  Seprately Identifiable Procedures:  Prior to initiating any procedures, risks/benefits/alternatives were explained to the patient and verbal consent obtained. Procedure: Bilateral ear microscopy and cerumen removal using microscope (CPT [phone]) - Mod 25 Pre-procedure diagnosis: Cerumen impaction bilateral external ears; eczematoid otitis externa (bilateral) Post-procedure diagnosis: same Indication: bilateral cerumen impaction; given patient's otologic complaints and history as well as for improved and comprehensive examination of external ear and tympanic membrane, bilateral otologic examination using microscope was performed and impacted cerumen removed  Procedure: Patient was placed semi-recumbent. Both ear canals were examined using the microscope with findings above. Impacted Ceruminous debris removed on left and on right using suction and currette with improvement in EAC examination and patency. Patient tolerated the procedure well.  Impression & Plans:  Sally Le is a 68 y.o. Le with:  1. Sensorineural hearing loss (SNHL) of both ears   2. Chronic eczematous otitis externa of both ears   3. Bilateral impacted cerumen    Bilateral chronic eczematoid otitis externa requiring regular  care.  Known b/l SNHL - consider amplification  - Recommend dermotic 2-3 rdops both ear canals once per week - Continue triamcinolone + nystatin cream weekly - Consider amplification at any point - f/u 6 months  Thank you for allowing me the opportunity to care for your patient. Please do not hesitate to contact me should you have any other questions.  Sincerely, Sally Aloe, MD Otolaryngologist (ENT), [facility] Phone: [phone] Fax: [phone]  06/22/2023, 10:04 AM   MDM:  Level 4 - [phone] Complexity/Problems addressed: mod - multiple chronic problems Data complexity: mod - independent review of notes, labs, independent interpretation of outside tests (audiogram) - Morbidity: mod  - Prescription Drug prescribed or managed: y

## 2023-06-22 NOTE — Telephone Encounter (Signed)
 Prescribed dermotic

## 2023-06-22 NOTE — Patient Instructions (Addendum)
 Dermotic drops - try using 2-3 drops once or twice per week

## 2023-07-06 ENCOUNTER — Other Ambulatory Visit: Payer: Self-pay

## 2023-07-08 ENCOUNTER — Other Ambulatory Visit: Payer: Self-pay

## 2023-07-30 ENCOUNTER — Encounter: Payer: Self-pay | Admitting: Family Medicine

## 2023-08-10 ENCOUNTER — Other Ambulatory Visit: Payer: Self-pay

## 2023-10-05 ENCOUNTER — Other Ambulatory Visit: Payer: Self-pay

## 2023-11-09 ENCOUNTER — Encounter (HOSPITAL_COMMUNITY): Payer: Self-pay | Admitting: Internal Medicine

## 2023-11-09 ENCOUNTER — Inpatient Hospital Stay (HOSPITAL_COMMUNITY)
Admission: EM | Admit: 2023-11-09 | Discharge: 2023-11-12 | DRG: 522 | Disposition: A | Discharge status: Home Health | Attending: Internal Medicine | Admitting: Internal Medicine

## 2023-11-09 ENCOUNTER — Emergency Department (HOSPITAL_COMMUNITY)

## 2023-11-09 ENCOUNTER — Inpatient Hospital Stay (HOSPITAL_COMMUNITY): Admitting: Anesthesiology

## 2023-11-09 ENCOUNTER — Other Ambulatory Visit: Payer: Self-pay

## 2023-11-09 DIAGNOSIS — Z8249 Family history of ischemic heart disease and other diseases of the circulatory system: Secondary | ICD-10-CM

## 2023-11-09 DIAGNOSIS — Z9089 Acquired absence of other organs: Secondary | ICD-10-CM

## 2023-11-09 DIAGNOSIS — Z8269 Family history of other diseases of the musculoskeletal system and connective tissue: Secondary | ICD-10-CM | POA: Diagnosis not present

## 2023-11-09 DIAGNOSIS — E559 Vitamin D deficiency, unspecified: Secondary | ICD-10-CM | POA: Diagnosis present

## 2023-11-09 DIAGNOSIS — Z880 Allergy status to penicillin: Secondary | ICD-10-CM | POA: Diagnosis not present

## 2023-11-09 DIAGNOSIS — Z820 Family history of epilepsy and other diseases of the nervous system: Secondary | ICD-10-CM | POA: Diagnosis not present

## 2023-11-09 DIAGNOSIS — Z79899 Other long term (current) drug therapy: Secondary | ICD-10-CM | POA: Diagnosis not present

## 2023-11-09 DIAGNOSIS — Z9889 Other specified postprocedural states: Secondary | ICD-10-CM | POA: Diagnosis not present

## 2023-11-09 DIAGNOSIS — W010XXA Fall on same level from slipping, tripping and stumbling without subsequent striking against object, initial encounter: Secondary | ICD-10-CM | POA: Diagnosis present

## 2023-11-09 DIAGNOSIS — Z825 Family history of asthma and other chronic lower respiratory diseases: Secondary | ICD-10-CM | POA: Diagnosis not present

## 2023-11-09 DIAGNOSIS — Z808 Family history of malignant neoplasm of other organs or systems: Secondary | ICD-10-CM

## 2023-11-09 DIAGNOSIS — W19XXXA Unspecified fall, initial encounter: Principal | ICD-10-CM

## 2023-11-09 DIAGNOSIS — Z803 Family history of malignant neoplasm of breast: Secondary | ICD-10-CM | POA: Diagnosis not present

## 2023-11-09 DIAGNOSIS — Z881 Allergy status to other antibiotic agents status: Secondary | ICD-10-CM | POA: Diagnosis not present

## 2023-11-09 DIAGNOSIS — Z85828 Personal history of other malignant neoplasm of skin: Secondary | ICD-10-CM

## 2023-11-09 DIAGNOSIS — Z91048 Other nonmedicinal substance allergy status: Secondary | ICD-10-CM

## 2023-11-09 DIAGNOSIS — S72001A Fracture of unspecified part of neck of right femur, initial encounter for closed fracture: Secondary | ICD-10-CM

## 2023-11-09 DIAGNOSIS — S72011A Unspecified intracapsular fracture of right femur, initial encounter for closed fracture: Secondary | ICD-10-CM | POA: Diagnosis present

## 2023-11-09 DIAGNOSIS — Z800 Family history of malignant neoplasm of digestive organs: Secondary | ICD-10-CM

## 2023-11-09 DIAGNOSIS — I872 Venous insufficiency (chronic) (peripheral): Secondary | ICD-10-CM | POA: Diagnosis present

## 2023-11-09 DIAGNOSIS — Z8349 Family history of other endocrine, nutritional and metabolic diseases: Secondary | ICD-10-CM

## 2023-11-09 DIAGNOSIS — S72012A Unspecified intracapsular fracture of left femur, initial encounter for closed fracture: Secondary | ICD-10-CM | POA: Diagnosis not present

## 2023-11-09 DIAGNOSIS — R030 Elevated blood-pressure reading, without diagnosis of hypertension: Secondary | ICD-10-CM | POA: Diagnosis present

## 2023-11-09 DIAGNOSIS — Z87442 Personal history of urinary calculi: Secondary | ICD-10-CM

## 2023-11-09 DIAGNOSIS — Z01818 Encounter for other preprocedural examination: Secondary | ICD-10-CM | POA: Diagnosis not present

## 2023-11-09 DIAGNOSIS — D62 Acute posthemorrhagic anemia: Secondary | ICD-10-CM | POA: Diagnosis not present

## 2023-11-09 DIAGNOSIS — Z9049 Acquired absence of other specified parts of digestive tract: Secondary | ICD-10-CM | POA: Diagnosis not present

## 2023-11-09 DIAGNOSIS — Z801 Family history of malignant neoplasm of trachea, bronchus and lung: Secondary | ICD-10-CM | POA: Diagnosis not present

## 2023-11-09 DIAGNOSIS — Y92099 Unspecified place in other non-institutional residence as the place of occurrence of the external cause: Secondary | ICD-10-CM

## 2023-11-09 DIAGNOSIS — S72009A Fracture of unspecified part of neck of unspecified femur, initial encounter for closed fracture: Secondary | ICD-10-CM | POA: Diagnosis present

## 2023-11-09 LAB — CBC WITH DIFFERENTIAL/PLATELET
Abs Immature Granulocytes: 0.05 K/uL (ref 0.00–0.07)
Basophils Absolute: 0 K/uL (ref 0.0–0.1)
Basophils Relative: 0 %
Eosinophils Absolute: 0 K/uL (ref 0.0–0.5)
Eosinophils Relative: 0 %
HCT: 43.2 % (ref 36.0–46.0)
Hemoglobin: 14.2 g/dL (ref 12.0–15.0)
Immature Granulocytes: 1 %
Lymphocytes Relative: 6 %
Lymphs Abs: 0.6 K/uL — ABNORMAL LOW (ref 0.7–4.0)
MCH: 31.8 pg (ref 26.0–34.0)
MCHC: 32.9 g/dL (ref 30.0–36.0)
MCV: 96.9 fL (ref 80.0–100.0)
Monocytes Absolute: 0.4 K/uL (ref 0.1–1.0)
Monocytes Relative: 4 %
Neutro Abs: 8.9 K/uL — ABNORMAL HIGH (ref 1.7–7.7)
Neutrophils Relative %: 89 %
Platelets: 176 K/uL (ref 150–400)
RBC: 4.46 MIL/uL (ref 3.87–5.11)
RDW: 14 % (ref 11.5–15.5)
WBC: 10.1 K/uL (ref 4.0–10.5)
nRBC: 0 % (ref 0.0–0.2)

## 2023-11-09 LAB — BASIC METABOLIC PANEL WITH GFR
Anion gap: 12 (ref 5–15)
BUN: 16 mg/dL (ref 8–23)
CO2: 22 mmol/L (ref 22–32)
Calcium: 9.2 mg/dL (ref 8.9–10.3)
Chloride: 104 mmol/L (ref 98–111)
Creatinine, Ser: 0.84 mg/dL (ref 0.44–1.00)
GFR, Estimated: >60 mL/min (ref >60)
Glucose, Bld: 119 mg/dL — ABNORMAL HIGH (ref 70–99)
Potassium: 3.9 mmol/L (ref 3.5–5.1)
Sodium: 137 mmol/L (ref 135–145)

## 2023-11-09 LAB — SURGICAL PCR SCREEN
MRSA, PCR: NEGATIVE
Staphylococcus aureus: NEGATIVE

## 2023-11-09 LAB — MAGNESIUM: Magnesium: 2.1 mg/dL (ref 1.7–2.4)

## 2023-11-09 MED ORDER — FENTANYL CITRATE PF 50 MCG/ML IJ SOSY
50.0000 ug | PREFILLED_SYRINGE | INTRAMUSCULAR | Status: AC
  Administered 2023-11-09: 50 ug via INTRAVENOUS
  Filled 2023-11-09: qty 2

## 2023-11-09 MED ORDER — HYDROMORPHONE HCL 1 MG/ML IJ SOLN
0.5000 mg | INTRAMUSCULAR | Status: DC | PRN
  Administered 2023-11-10: 0.5 mg via INTRAVENOUS
  Filled 2023-11-09: qty 0.5

## 2023-11-09 MED ORDER — DOCUSATE SODIUM 100 MG PO CAPS
100.0000 mg | ORAL_CAPSULE | Freq: Two times a day (BID) | ORAL | Status: DC
  Administered 2023-11-09: 100 mg via ORAL
  Filled 2023-11-09: qty 1

## 2023-11-09 MED ORDER — METHOCARBAMOL 500 MG PO TABS
500.0000 mg | ORAL_TABLET | Freq: Once | ORAL | Status: AC
  Administered 2023-11-09: 500 mg via ORAL
  Filled 2023-11-09: qty 1

## 2023-11-09 MED ORDER — DEXAMETHASONE SODIUM PHOSPHATE 4 MG/ML IJ SOLN
INTRAMUSCULAR | Status: DC | PRN
  Administered 2023-11-09: 10 mg via PERINEURAL

## 2023-11-09 MED ORDER — ENOXAPARIN SODIUM 40 MG/0.4ML IJ SOSY
40.0000 mg | PREFILLED_SYRINGE | INTRAMUSCULAR | Status: DC
Start: 2023-11-09 — End: 2023-11-10
  Administered 2023-11-09: 40 mg via SUBCUTANEOUS
  Filled 2023-11-09: qty 0.4

## 2023-11-09 MED ORDER — MIDAZOLAM HCL 2 MG/2ML IJ SOLN
1.0000 mg | INTRAMUSCULAR | Status: AC
  Administered 2023-11-09: 1 mg via INTRAVENOUS
  Filled 2023-11-09: qty 2

## 2023-11-09 MED ORDER — BUPIVACAINE-EPINEPHRINE (PF) 0.5% -1:200000 IJ SOLN
INTRAMUSCULAR | Status: DC | PRN
  Administered 2023-11-09: 30 mL

## 2023-11-09 MED ORDER — HYDROCODONE-ACETAMINOPHEN 5-325 MG PO TABS: 1.0000 | ORAL_TABLET | Freq: Four times a day (QID) | ORAL | Status: DC | PRN

## 2023-11-09 MED ORDER — HYDROMORPHONE HCL 1 MG/ML IJ SOLN
0.5000 mg | INTRAMUSCULAR | Status: DC | PRN
  Administered 2023-11-09: 0.5 mg via INTRAVENOUS
  Filled 2023-11-09: qty 1

## 2023-11-09 NOTE — Anesthesia Procedure Notes (Signed)
 Anesthesia Regional Block: Femoral nerve block   Pre-Anesthetic Checklist: , timeout performed,  Correct Patient, Correct Site, Correct Laterality,  Correct Procedure, Correct Position, site marked,  Risks and benefits discussed,  Surgical consent,  Pre-op evaluation,  At surgeon's request and post-op pain management  Laterality: Right  Prep: Maximum Sterile Barrier Precautions used, chloraprep       Needles:  Injection technique: Single-shot  Needle Type: Echogenic Needle     Needle Length: 5cm  Needle Gauge: 22     Additional Needles:   Procedures:,,,, ultrasound used (permanent image in chart),,    Narrative:  Start time: 11/09/2023 4:48 PM End time: 11/09/2023 4:52 PM Injection made incrementally with aspirations every 5 mL.  Performed by: Personally  Anesthesiologist: Keneth Lynwood POUR, MD

## 2023-11-09 NOTE — Anesthesia Preprocedure Evaluation (Signed)
 Anesthesia Evaluation  Patient identified by MRN, date of birth, ID band Patient awake    Reviewed: Allergy & Precautions, NPO status , Patient's Chart, lab work & pertinent test results, reviewed documented beta blocker date and time   History of Anesthesia Complications Negative for: history of anesthetic complications  Airway Mallampati: III  TM Distance: >3 FB     Dental no notable dental hx.    Pulmonary  Seasonal allergies   breath sounds clear to auscultation       Cardiovascular  Rhythm:Regular Rate:Normal     Neuro/Psych    GI/Hepatic   Endo/Other    Renal/GU Renal disease     Musculoskeletal R hip fx   Abdominal   Peds  Hematology   Anesthesia Other Findings   Reproductive/Obstetrics                              Anesthesia Physical Anesthesia Plan  ASA: 2  Anesthesia Plan: Regional   Post-op Pain Management: Regional block*   Induction:   PONV Risk Score and Plan:   Airway Management Planned:   Additional Equipment:   Intra-op Plan:   Post-operative Plan:   Informed Consent:   Plan Discussed with:   Anesthesia Plan Comments:          Anesthesia Quick Evaluation

## 2023-11-09 NOTE — H&P (Signed)
 History and Physical    Sally Le FMW:[id number] DOB: Jun 27, 1955 DOA: 11/09/2023  I have briefly reviewed the patient's prior medical records in [facility]  PCP: Alvia Corean CROME, FNP  Patient coming from: home  Chief Complaint: Right hip pain after a fall  HPI: Sally Le is a 68 y.o. female with without significant medical history comes into the hospital with right hip pain after having a fall at home.  She was in her normal state of health, and around 11 AM her shoes got caught in the flooring, she lost balance and landed on her right hip.  She has had pain following that, and when unable to walk, called EMS and came to the hospital.  She denies any head injury.  She denies any chest pain, denies any palpitations.  She denies any loss of consciousness.  Prior to this happening she has not had any acute illness, no fever, chills, abdominal pain, nausea or vomiting.  She is not a smoker and drinks alcohol  very occasionally.  ED Course: In the emergency room she is afebrile, blood pressure slightly on the high side in the 160s, satting well on room air.  Blood work is unremarkable.  Hip x-ray showed moderately displaced fracture of the right femoral neck.  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 She has never used smokeless tobacco. She reports current alcohol  use of about 1.0 standard drink of alcohol  per week. She reports that she does not use  drugs.  Allergies  Allergen Reactions   Adhesive [Tape] Rash    The adhesive, not the tape is the source of the problem.   Erythromycin     nausea    Family History  Problem Relation Age of Onset   Parkinson's disease Mother    Breast cancer Mother    Cancer Mother 6       Estrogen and progestin positive breast cancer   Cancer Father        oat cell lung CA with mets to brain   Pancreatic cancer Maternal Grandfather    Cancer Maternal Grandfather        pancreatic   Obesity Sister    Spondylolisthesis Sister    Asthma Sister    Cancer Maternal Aunt 42       lung cancer (non-smoker)   Heart disease Paternal Grandmother    Lung cancer Unknown     Prior to Admission medications   Medication Sig Start Date End Date Taking? Authorizing Provider  AMBULATORY NON FORMULARY MEDICATION Take 1.5 oz by mouth daily. Medication Name: Vallarie burger juice    [provider]  B Complex-C (B-COMPLEX WITH VITAMIN C) tablet Take 1 tablet by mouth daily.    [provider]  CALCIUM-MAGNESIUM  PO Take by mouth 3 (three) times daily.    [provider]  CONNELL  PO Take 500 mg by mouth daily.    [provider]  Cholecalciferol (VITAMIN D  PO) Take 2,000 Units/day by mouth daily.    [provider]  Elderberry-Vitamin C-Zinc (ELDERBERRY EXTRACT PO) Take by mouth.    [provider]  Fluocinolone  Acetonide (DERMOTIC ) 0.01 % OIL Place 3 drops in ear(s) once a week. 06/22/23   Patel, Kunjan B, MD  LUTEIN PO Take by mouth.    [provider]  magnesium  30 MG tablet Take 30 mg by mouth 2 (two) times daily.    [provider]  Menaquinone-7 (VITAMIN K2 PO) Take by mouth. Vitamin K2-7    [provider]  nystatin cream (MYCOSTATIN)  06/04/17   [provider]  nystatin-triamcinolone ointment (MYCOLOG) Apply 1 Application topically 2 (two) times daily.    [provider]  Omega-3 Fatty Acids (FISH OIL) 1200  MG CAPS Take 1,200 mg by mouth daily.    [provider]  UNABLE TO FIND Med Name: Vein Formula    [provider]    Physical Exam: Vitals:   11/09/23 1257 11/09/23 1302  BP:  (!) 162/69  Resp:  13  Temp:  98.4 F (36.9 C)  TempSrc:  Oral  SpO2:  100%  Weight: 74.4 kg   Height: 5' 4.5 (1.638 m)     Constitutional: NAD, calm, comfortable Eyes:  lids and conjunctivae normal ENMT: Mucous membranes are moist. Neck: normal, supple Respiratory: clear to auscultation bilaterally, no wheezing, no crackles. Normal respiratory effort. Cardiovascular: Regular rate and rhythm, no murmurs / rubs / gallops. No extremity edema.  Abdomen: no tenderness, no distention Musculoskeletal: no clubbing / cyanosis. Normal muscle tone.  Skin: no obvious rashes Neurologic: Grossly nonfocal  Labs on Admission: I have personally reviewed following labs and imaging studies  CBC: Recent Labs  Lab 11/09/23 1319  WBC 10.1  NEUTROABS 8.9*  HGB 14.2  HCT 43.2  MCV 96.9  PLT 176   Basic Metabolic Panel: Recent Labs  Lab 11/09/23 1406  NA 137  K 3.9  CL 104  CO2 22  GLUCOSE 119*  BUN 16  CREATININE 0.84  CALCIUM 9.2   Liver Function Tests: No results for input(s): AST, ALT, ALKPHOS, BILITOT, PROT, ALBUMIN  in the last 168 hours. Coagulation Profile: No results for input(s): INR, PROTIME in the last 168 hours. BNP (last 3 results) No results for input(s): PROBNP in the last 8760 hours. CBG: No results for input(s): GLUCAP in the last 168 hours. Thyroid Function Tests: No results for input(s): TSH, T4TOTAL, FREET4, T3FREE, THYROIDAB in the last 72 hours. Urine analysis:    Component Value Date/Time   BILIRUBINUR neg 02/26/2012 1527   PROTEINUR neg 02/26/2012 1527   UROBILINOGEN 0.2 02/26/2012 1527   NITRITE neg 02/26/2012 1527   LEUKOCYTESUR Trace 02/26/2012 1527     Radiological Exams on Admission: DG Hip Unilat With Pelvis 2-3  Views Right Result Date: 11/09/2023 CLINICAL DATA:  Right hip pain status post fall. EXAM: DG HIP (WITH OR WITHOUT PELVIS) 2-3V RIGHT COMPARISON:  None Available. FINDINGS: The bones appear adequately mineralized. There is an acute fracture of the right femoral neck which demonstrates up to 1.5 cm of superior displacement. No evidence of pelvic fracture, dislocation or underlying femoral head osteonecrosis. Mild degenerative changes of both hips and sacroiliac joints. IMPRESSION: Acute, moderately displaced fracture of the right femoral neck. Electronically Signed   By: Elsie Perone M.D.   On: 11/09/2023 14:05   DG Chest [facility]  1 View Result Date: 11/09/2023 CLINICAL DATA:  Witnessed fall.  Right hip pain. EXAM: PORTABLE CHEST 1 VIEW COMPARISON:  None Available. FINDINGS: 1324 hours. The heart size and mediastinal contours are normal. The lungs are clear. There is no pleural effusion or pneumothorax. No acute osseous findings are identified. Mild scoliosis. Telemetry leads overlie the chest. IMPRESSION: No evidence of acute chest injury. Mild scoliosis. Electronically Signed   By: Elsie Perone M.D.   On: 11/09/2023 14:04    EKG: Independently reviewed. Sinus rhythm   Assessment/Plan Principal problem Right hip fracture-orthopedic surgery consulted, she will be taken to the OR tomorrow for operative repair.  PT/OT postoperatively, DVT prophylaxis per orthopedics  Active problems Elevated blood pressure-in the 160s on arrival, likely due to pain.  Monitor  DVT prophylaxis: Lovenox  Code Status: Full code Family Communication: spouse at bedside  Bed Type: Medsurg Consults called: Orthopedic surgery   Obs/Inp: inpatient  At the time of admission, it appears that the appropriate admission status for this patient is INPATIENT as it is expected that patient will require hospital care > 2 midnights. This is judged to be reasonable and necessary in order to provide the required intensity of service  to ensure the patient's safety given: presenting symptoms, initial radiographic and laboratory data and in the context of their chronic comorbidities. Together, these circumstances are felt to place patient at high at high risk for further clinical deterioration threatening life, limb, or organ.  Nilda Fendt, MD, PhD [facility]  Contact via [email or web address]  11/09/2023, 3:29 PM

## 2023-11-09 NOTE — Progress Notes (Signed)
 Short Stay Nursing Note: Regional Block by Anesthesiologist secondary to Right Hip Fx Age, DOB, Name, Site, Injury all confirmed by MDA, RN and Patient Allergies also reviewed Current Medications reviewed Lab results reviewed Last pain medication rec also reviewed Placed on cont cardiac monitoring with POX, ETCO2 and NBP q5 min Safety measures in place

## 2023-11-09 NOTE — Progress Notes (Signed)
 Consulted received from EDP.  I have reviewed imaging.  Patient will need total hip replacement.  Plan is to perform tomorrow.  Patient may have a diet today.  NPO after midnight. Full consult to follow.

## 2023-11-09 NOTE — Consult Note (Signed)
 ORTHOPAEDIC CONSULTATION  REQUESTING PHYSICIAN: Trixie Nilda HERO, MD  Chief Complaint: Right femoral neck fracture  HPI: Sally Le is a 68 y.o. female with without significant medical history comes into the hospital with right hip pain after having a fall at home.  She was in her normal state of health, and around 11 AM her shoes got caught in the flooring, she lost balance and landed on her right hip.  She has had pain following that, and when unable to walk, called EMS and came to the hospital.  She denies any head injury.  She denies any chest pain, denies any palpitations.  She denies any loss of consciousness.  Prior to this happening she has not had any acute illness, no fever, chills, abdominal pain, nausea or vomiting.  She is not a smoker and drinks alcohol  very occasionally.  Ortho consulted for surgical evaluation.  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, pre-basal cell RIGHT cheek   Urticaria    Venous insufficiency    Vitamin D  deficiency    Past Surgical History:  Procedure Laterality Date   ADENOIDECTOMY     APPENDECTOMY     DERMOID CYST  EXCISION     TONSILLECTOMY     TONSILLECTOMY     Social History   Socioeconomic History   Marital status: Married    Spouse name: Gladis   Number of children: 0   Years of education: 15   Highest education level: Not on file  Occupational History   Occupation: Magazine features editor: First Theatre manager For [employer]  Tobacco Use   Smoking status: Never   Smokeless tobacco: Never  Vaping Use   Vaping status: Never Used  Substance and Sexual Activity   Alcohol  use: Yes    Alcohol /week: 1.0 standard drink of alcohol     Types: 1 Glasses of wine per week   Drug use: No   Sexual activity: Yes    Partners: Male    Birth control/protection: Post-menopausal  Other Topics Concern   Not on file  Social History  Narrative   Exercises 4xweekly.  Lives with her husband.   Social Drivers of Corporate investment banker Strain: Not on file  Food Insecurity: Low Risk  (12/26/2022)   Received from [facility]   Hunger Vital Sign    Within the past 12 months, you worried that your food would run out before you got money to buy more: Never true    Within the past 12 months, the food you bought just didn't last and you didn't have money to get more. : Never true  Transportation Needs: No Transportation Needs (12/26/2022)   Received from [facility]    In the past 12 months, has lack of reliable transportation kept you from medical appointments, meetings, work or from getting things needed for daily living? : No  Physical Activity: Not on file  Stress: Not on file  Social Connections: Not on file   Family History  Problem Relation Age of Onset   Parkinson's disease Mother    Breast cancer Mother    Cancer Mother 46       Estrogen and progestin positive breast cancer   Cancer Father        oat cell lung CA with mets to brain   Pancreatic cancer Maternal Grandfather  Cancer Maternal Grandfather        pancreatic   Obesity Sister    Spondylolisthesis Sister    Asthma Sister    Cancer Maternal Aunt 3       lung cancer (non-smoker)   Heart disease Paternal Grandmother    Lung cancer Unknown    Allergies  Allergen Reactions   Amoxicillin Dermatitis   Cat Dander     Other Reaction(s): Unknown   Sodium Tetradecyl Sulfate     Other Reaction(s): Unknown   Wound Dressing Adhesive     Other Reaction(s): Unknown   Adhesive [Tape] Rash    The adhesive, not the tape is the source of the problem.   Erythromycin     nausea  Other Reaction(s): Unknown   Prior to Admission medications   Medication Sig Start Date End Date Taking? Authorizing Provider  AMBULATORY NON FORMULARY MEDICATION Take 1.5 oz by mouth daily. Medication Name: Vallarie burger juice    [provider]  B Complex-C (B-COMPLEX WITH VITAMIN C) tablet Take 1 tablet by mouth daily.    [provider]  CALCIUM-MAGNESIUM  PO Take by mouth 3 (three) times daily.    [provider]  CHERRY PO Take 500 mg by mouth daily.    [provider]  Cholecalciferol (VITAMIN D  PO) Take 2,000 Units/day by mouth daily.    [provider]  Elderberry-Vitamin C-Zinc (ELDERBERRY EXTRACT PO) Take by mouth.    [provider]  Fluocinolone  Acetonide (DERMOTIC ) 0.01 % OIL Place 3 drops in ear(s) once a week. 06/22/23   Patel, Kunjan B, MD  LUTEIN PO Take by mouth.    [provider]  magnesium  30 MG tablet Take 30 mg by mouth 2 (two) times daily.    [provider]  Menaquinone-7 (VITAMIN K2 PO) Take by mouth. Vitamin K2-7    [provider]  nystatin cream (MYCOSTATIN)  06/04/17   [provider]  nystatin-triamcinolone ointment (MYCOLOG) Apply 1 Application topically 2 (two) times daily.    [provider]  Omega-3 Fatty Acids (FISH OIL) 1200 MG CAPS Take 1,200 mg by mouth daily.    [provider]  UNABLE TO FIND Med Name: Vein Formula    [provider]   DG Hip Unilat With Pelvis 2-3 Views Right Result Date: 11/09/2023 CLINICAL DATA:  Right hip pain status post fall. EXAM: DG HIP (WITH OR WITHOUT PELVIS) 2-3V RIGHT COMPARISON:  None Available. FINDINGS: The bones appear adequately mineralized. There is an acute fracture of the right femoral neck which demonstrates up to 1.5 cm of superior displacement. No evidence of pelvic fracture, dislocation or underlying femoral head osteonecrosis. Mild degenerative changes of both hips and sacroiliac joints. IMPRESSION: Acute, moderately displaced fracture of the right femoral neck. Electronically Signed   By: Elsie Perone M.D.   On: 11/09/2023 14:05   DG Chest Port 1 View Result Date: 11/09/2023 CLINICAL DATA:  Witnessed fall.  Right hip pain. EXAM: PORTABLE CHEST 1  VIEW COMPARISON:  None Available. FINDINGS: 1324 hours. The heart size and mediastinal contours are normal. The lungs are clear. There is no pleural effusion or pneumothorax. No acute osseous findings are identified. Mild scoliosis. Telemetry leads overlie the chest. IMPRESSION: No evidence of acute chest injury. Mild scoliosis. Electronically Signed   By: Elsie Perone M.D.   On: 11/09/2023 14:04    All pertinent xrays, MRI, CT independently reviewed and interpreted  Positive ROS: All other systems have been reviewed and  were otherwise negative with the exception of those mentioned in the HPI and as above.  Physical Exam: General: No acute distress Cardiovascular: No pedal edema Respiratory: No cyanosis, no use of accessory musculature GI: No organomegaly, abdomen is soft and non-tender Skin: No lesions in the area of chief complaint Neurologic: Sensation intact distally Psychiatric: Patient is at baseline mood and affect Lymphatic: No axillary or cervical lymphadenopathy  MUSCULOSKELETAL:  - severe pain with movement of the hip and extremity - skin intact - NVI distally - compartments soft  Assessment: Right femoral neck fracture  Plan: - THA is recommended for pain relief, quality of life and early mobilization - patient and family are aware of r/b/a and wish to proceed, informed consent obtained - medical optimization per primary team - surgery is planned for Tuesday - NPO after midnight  Thank you for the consult and the opportunity to see Ms. Warman  [name], MD [facility] 4:31 PM

## 2023-11-09 NOTE — Anesthesia Preprocedure Evaluation (Signed)
 Anesthesia Evaluation  Patient identified by MRN, date of birth, ID band Patient awake    Reviewed: Allergy & Precautions, NPO status , Patient's Chart, lab work & pertinent test results  Airway Mallampati: II  TM Distance: >3 FB Neck ROM: Full    Dental no notable dental hx.    Pulmonary neg pulmonary ROS   Pulmonary exam normal        Cardiovascular negative cardio ROS Normal cardiovascular exam     Neuro/Psych negative neurological ROS  negative psych ROS   GI/Hepatic negative GI ROS, Neg liver ROS,,,  Endo/Other  negative endocrine ROS    Renal/GU      Musculoskeletal   Abdominal   Peds  Hematology negative hematology ROS (+)   Anesthesia Other Findings Right hip fracture  Reproductive/Obstetrics                              Anesthesia Physical Anesthesia Plan  ASA: 2  Anesthesia Plan: General   Post-op Pain Management:    Induction: Intravenous  PONV Risk Score and Plan: 3 and Ondansetron , Dexamethasone , Midazolam  and Treatment may vary due to age or medical condition  Airway Management Planned: Oral ETT  Additional Equipment:   Intra-op Plan:   Post-operative Plan: Extubation in OR  Informed Consent: I have reviewed the patients History and Physical, chart, labs and discussed the procedure including the risks, benefits and alternatives for the proposed anesthesia with the patient or authorized representative who has indicated his/her understanding and acceptance.     Dental advisory given  Plan Discussed with: CRNA  Anesthesia Plan Comments:         Anesthesia Quick Evaluation

## 2023-11-09 NOTE — ED Triage Notes (Signed)
 Pt BIB EMS from home. Witnessed fall (no thinners, no LOC) on kitchen mat and slammed into fridge, landing directly on right side on floor. Unable to move right leg with hip pain. Pulse felt on pedal pulse.  No blood thinners or significant medical history. Did not hit head, only shoulder and leg, no obvious deformities   EMS vitals  BP 140/80 HR 90 SPO2 95 RA CBG 116

## 2023-11-09 NOTE — Progress Notes (Addendum)
 Short Stay Nursing Note Post Regional Block Alert and oriented Has strong Plantar and Dorsal Flexion on RT Color or RLE WNL Capillary refill WNL of RLE 2-3+ rt post tib pulse easily palpable HR 81/min, NSR RR 18-20/min, even and non-labored, BS clear, speech WNL,  NBP 128/56 ETCO2 - Temp WNL Aldrete Score 9-10

## 2023-11-09 NOTE — Plan of Care (Signed)

## 2023-11-09 NOTE — ED Provider Notes (Signed)
  EMERGENCY DEPARTMENT AT [facility] Provider Note   CSN: [id number] Arrival date & time: 11/09/23  1244     Patient presents with: Sally Le is a 68 y.o. female.    Fall  Patient presents after fall.  She has no known chronic medical conditions.  The only medicines she takes daily are supplements.  This morning, she was in her normal state of health.  At around 11 AM, she describes a mechanical fall that occurred at home.  She states that her thick soled shoes got caught on the flooring and this caused her to fall.  When she did, she landed directly onto her right hip.  She has since had pain in the right hip area which radiates down anterior thigh.  She was unable to stand at home.  She did take 400 mg of ibuprofen.  She remained on the floor with knees in a flexed position trying to wait for the pain to subside to stand up.  After 2 hours, EMS was called.  Patient denies any other areas of discomfort.  She denies striking her head.  She is not on a blood thinner.     Prior to Admission medications   Medication Sig Start Date End Date Taking? Authorizing Provider  AMBULATORY NON FORMULARY MEDICATION Take 1.5 oz by mouth daily. Medication Name: Vallarie burger juice    [provider]  B Complex-C (B-COMPLEX WITH VITAMIN C) tablet Take 1 tablet by mouth daily.    [provider]  CALCIUM-MAGNESIUM  PO Take by mouth 3 (three) times daily.    [provider]  CHERRY PO Take 500 mg by mouth daily.    [provider]  Cholecalciferol (VITAMIN D  PO) Take 2,000 Units/day by mouth daily.    [provider]  Elderberry-Vitamin C-Zinc (ELDERBERRY EXTRACT PO) Take by mouth.    [provider]  Fluocinolone  Acetonide (DERMOTIC ) 0.01 % OIL Place 3 drops in ear(s) once a week. 06/22/23   Patel, Kunjan B, MD  LUTEIN PO Take by mouth.    [provider]  magnesium  30 MG tablet Take 30 mg by mouth 2 (two)  times daily.    [provider]  Menaquinone-7 (VITAMIN K2 PO) Take by mouth. Vitamin K2-7    [provider]  nystatin cream (MYCOSTATIN)  06/04/17   [provider]  nystatin-triamcinolone ointment (MYCOLOG) Apply 1 Application topically 2 (two) times daily.    [provider]  Omega-3 Fatty Acids (FISH OIL) 1200 MG CAPS Take 1,200 mg by mouth daily.    [provider]  UNABLE TO FIND Med Name: Vein Formula    [provider]    Allergies: Adhesive [tape] and Erythromycin    Review of Systems  Musculoskeletal:  Positive for arthralgias and myalgias.  All other systems reviewed and are negative.   Updated Vital Signs BP (!) 162/69   Temp 98.4 F (36.9 C) (Oral)   Resp 13   Ht 5' 4.5 (1.638 m)   Wt 74.4 kg   LMP 06/05/2011   SpO2 100%   BMI 27.72 kg/m   Physical Exam Vitals and nursing note reviewed.  Constitutional:      General: She is not in acute distress.    Appearance: Normal appearance. She is well-developed. She is not ill-appearing, toxic-appearing or diaphoretic.  HENT:     Head: Normocephalic and atraumatic.     Right Ear: External ear normal.  Left Ear: External ear normal.     Nose: Nose normal.     Mouth/Throat:     Mouth: Mucous membranes are moist.  Eyes:     Extraocular Movements: Extraocular movements intact.     Conjunctiva/sclera: Conjunctivae normal.  Cardiovascular:     Rate and Rhythm: Normal rate and regular rhythm.  Pulmonary:     Effort: Pulmonary effort is normal. No respiratory distress.  Chest:     Chest wall: No tenderness.  Abdominal:     General: There is no distension.     Palpations: Abdomen is soft.     Tenderness: There is no abdominal tenderness.  Musculoskeletal:        General: No swelling.     Cervical back: Normal range of motion and neck supple.     Comments: Slight shortening of right leg.  Skin:    General: Skin is warm and dry.     Capillary Refill: Capillary  refill takes less than 2 seconds.     Coloration: Skin is not jaundiced or pale.  Neurological:     General: No focal deficit present.     Mental Status: She is alert and oriented to person, place, and time.  Psychiatric:        Mood and Affect: Mood normal.        Behavior: Behavior normal.     (all labs ordered are listed, but only abnormal results are displayed) Labs Reviewed  CBC WITH DIFFERENTIAL/PLATELET - Abnormal; Notable for the following components:      Result Value   Neutro Abs 8.9 (*)    Lymphs Abs 0.6 (*)    All other components within normal limits  BASIC METABOLIC PANEL WITH GFR - Abnormal; Notable for the following components:   Glucose, Bld 119 (*)    All other components within normal limits  MAGNESIUM     EKG: EKG Interpretation Date/Time:  Monday November 09 2023 13:22:16 EDT Ventricular Rate:  86 PR Interval:  135 QRS Duration:  98 QT Interval:  409 QTC Calculation: 490 R Axis:   90  Text Interpretation: Sinus rhythm Borderline right axis deviation Borderline prolonged QT interval Confirmed by Melvenia Motto [phone]) on 11/09/2023 2:31:57 PM  Radiology: ARCOLA Hip Unilat With Pelvis 2-3 Views Right Result Date: 11/09/2023 CLINICAL DATA:  Right hip pain status post fall. EXAM: DG HIP (WITH OR WITHOUT PELVIS) 2-3V RIGHT COMPARISON:  None Available. FINDINGS: The bones appear adequately mineralized. There is an acute fracture of the right femoral neck which demonstrates up to 1.5 cm of superior displacement. No evidence of pelvic fracture, dislocation or underlying femoral head osteonecrosis. Mild degenerative changes of both hips and sacroiliac joints. IMPRESSION: Acute, moderately displaced fracture of the right femoral neck. Electronically Signed   By: Elsie Perone M.D.   On: 11/09/2023 14:05   DG Chest Port 1 View Result Date: 11/09/2023 CLINICAL DATA:  Witnessed fall.  Right hip pain. EXAM: PORTABLE CHEST 1 VIEW COMPARISON:  None Available. FINDINGS: 1324  hours. The heart size and mediastinal contours are normal. The lungs are clear. There is no pleural effusion or pneumothorax. No acute osseous findings are identified. Mild scoliosis. Telemetry leads overlie the chest. IMPRESSION: No evidence of acute chest injury. Mild scoliosis. Electronically Signed   By: Elsie Perone M.D.   On: 11/09/2023 14:04     Procedures   Medications Ordered in the ED  HYDROmorphone  (DILAUDID ) injection 0.5 mg (0.5 mg Intravenous Given 11/09/23 1320)  methocarbamol  (ROBAXIN ) tablet 500  mg (500 mg Oral Given 11/09/23 1341)                                    Medical Decision Making Amount and/or Complexity of Data Reviewed Labs: ordered. Radiology: ordered.  Risk Prescription drug management. Decision regarding hospitalization.   This patient presents to the ED for concern of fall, this involves an extensive number of treatment options, and is a complaint that carries with it a high risk of complications and morbidity.  The differential diagnosis includes acute injuries   Co morbidities / Chronic conditions that complicate the patient evaluation  N/A   Additional history obtained:  Additional history obtained from EMR External records from outside source obtained and reviewed including N/A   Lab Tests:  I Ordered, and personally interpreted labs.  The pertinent results include: Normal hemoglobin, no leukocytosis, normal kidney function, normal electrolytes   Imaging Studies ordered:  I ordered imaging studies including x-ray of chest and right hip I independently visualized and interpreted imaging which showed moderately displaced fracture right femoral neck I agree with the radiologist interpretation   Cardiac Monitoring: / EKG:  The patient was maintained on a cardiac monitor.  I personally viewed and interpreted the cardiac monitored which showed an underlying rhythm of: Sinus rhythm   Problem List / ED Course / Critical  interventions / Medication management  Patient presents after mechanical fall that occurred at home.  This occurred approximately 2 hours prior to arrival.  Patient fell and landed directly onto her right hip.  She has since had pain in this area.  Pain radiates down her right thigh.  She does not have tenderness to her right thigh, but rather states that this causes the pain to improve.  I suspect a muscle spasm in the thigh area.  She does have slight shortening of her right leg on exam raising concern for possible hip fracture.  She has no other areas of pain or tenderness on exam.  Robaxin  and Dilaudid  were ordered for analgesia.  Workup was initiated.  X-ray confirms right hip fracture.  I spoke with orthopedic surgeon on-call, Dr. Jerri, who will see in consult.  On reassessment, patient's pain is improved.  Lab work was unremarkable.  Patient was admitted for further management. I ordered medication including Dilaudid  and Robaxin  for analgesia   Reevaluation of the patient after these medicines showed that the patient improved I have reviewed the patients home medicines and have made adjustments as needed   Consultations Obtained:  I requested consultation with the orthopedic surgeon, Dr. Jerri,  and discussed lab and imaging findings as well as pertinent plan - they recommend: Will see in consult, possible surgery today, keep n.p.o. for now   Social Determinants of Health:  Lives at home with husband     Final diagnoses:  Fall, initial encounter  Closed displaced fracture of right femoral neck [facility])    ED Discharge Orders     None          Melvenia Motto, MD 11/09/23 1511

## 2023-11-09 NOTE — Anesthesia Postprocedure Evaluation (Signed)
 Anesthesia Post Note  Patient: Sally Le  Procedure(s) Performed: AN AD HOC NERVE BLOCK     Patient location during evaluation: Other Anesthesia Type: Regional Level of consciousness: awake and alert Pain management: pain level controlled Vital Signs Assessment: post-procedure vital signs reviewed and stable Respiratory status: spontaneous breathing, nonlabored ventilation, respiratory function stable and patient connected to nasal cannula oxygen Cardiovascular status: blood pressure returned to baseline and stable Postop Assessment: no apparent nausea or vomiting Anesthetic complications: no   No notable events documented.  Last Vitals:  Vitals:   11/09/23 1638 11/09/23 1640  BP:  (!) 159/95  Pulse: 76 75  Resp: 20 18  Temp:    SpO2: 100% 100%    Last Pain:  Vitals:   11/09/23 1640  TempSrc:   PainSc: 0-No pain                 Lynwood MARLA Cornea

## 2023-11-10 ENCOUNTER — Inpatient Hospital Stay (HOSPITAL_COMMUNITY)

## 2023-11-10 ENCOUNTER — Encounter (HOSPITAL_COMMUNITY)
Admission: EM | Disposition: A | Discharge status: Home Health | Payer: Self-pay | Source: Home / Self Care | Attending: Internal Medicine

## 2023-11-10 ENCOUNTER — Inpatient Hospital Stay (HOSPITAL_COMMUNITY): Payer: Self-pay | Admitting: Anesthesiology

## 2023-11-10 ENCOUNTER — Encounter (HOSPITAL_COMMUNITY): Payer: Self-pay | Admitting: Internal Medicine

## 2023-11-10 DIAGNOSIS — S72011A Unspecified intracapsular fracture of right femur, initial encounter for closed fracture: Secondary | ICD-10-CM | POA: Diagnosis not present

## 2023-11-10 DIAGNOSIS — Z01818 Encounter for other preprocedural examination: Secondary | ICD-10-CM | POA: Diagnosis not present

## 2023-11-10 DIAGNOSIS — S72012A Unspecified intracapsular fracture of left femur, initial encounter for closed fracture: Secondary | ICD-10-CM | POA: Diagnosis not present

## 2023-11-10 HISTORY — PX: TOTAL HIP ARTHROPLASTY: SHX124

## 2023-11-10 LAB — BASIC METABOLIC PANEL WITH GFR
Anion gap: 12 (ref 5–15)
BUN: 16 mg/dL (ref 8–23)
CO2: 21 mmol/L — ABNORMAL LOW (ref 22–32)
Calcium: 9.5 mg/dL (ref 8.9–10.3)
Chloride: 105 mmol/L (ref 98–111)
Creatinine, Ser: 0.86 mg/dL (ref 0.44–1.00)
GFR, Estimated: >60 mL/min (ref >60)
Glucose, Bld: 144 mg/dL — ABNORMAL HIGH (ref 70–99)
Potassium: 4.1 mmol/L (ref 3.5–5.1)
Sodium: 138 mmol/L (ref 135–145)

## 2023-11-10 LAB — CBC
HCT: 41.9 % (ref 36.0–46.0)
Hemoglobin: 13.3 g/dL (ref 12.0–15.0)
MCH: 30.8 pg (ref 26.0–34.0)
MCHC: 31.7 g/dL (ref 30.0–36.0)
MCV: 97 fL (ref 80.0–100.0)
Platelets: 177 K/uL (ref 150–400)
RBC: 4.32 MIL/uL (ref 3.87–5.11)
RDW: 13.9 % (ref 11.5–15.5)
WBC: 10.3 K/uL (ref 4.0–10.5)
nRBC: 0 % (ref 0.0–0.2)

## 2023-11-10 LAB — TYPE AND SCREEN
ABO/RH(D): A POS
Antibody Screen: NEGATIVE

## 2023-11-10 LAB — ABO/RH: ABO/RH(D): A POS

## 2023-11-10 LAB — HIV ANTIBODY (ROUTINE TESTING W REFLEX): HIV Screen 4th Generation wRfx: NONREACTIVE

## 2023-11-10 SURGERY — ARTHROPLASTY, HIP, TOTAL, ANTERIOR APPROACH
Anesthesia: General | Site: Hip | Laterality: Right

## 2023-11-10 MED ORDER — SORBITOL 70 % SOLN: 30.0000 mL | Freq: Every day | Status: DC | PRN

## 2023-11-10 MED ORDER — ALBUMIN HUMAN 5 % IV SOLN: INTRAVENOUS | Status: DC | PRN

## 2023-11-10 MED ORDER — VANCOMYCIN HCL 1000 MG IV SOLR
INTRAVENOUS | Status: AC
  Filled 2023-11-10: qty 20

## 2023-11-10 MED ORDER — PHENOL 1.4 % MT LIQD: 1.0000 | OROMUCOSAL | Status: DC | PRN

## 2023-11-10 MED ORDER — TRANEXAMIC ACID 1000 MG/10ML IV SOLN
2000.0000 mg | INTRAVENOUS | Status: DC
  Filled 2023-11-10: qty 20

## 2023-11-10 MED ORDER — PHENYLEPHRINE HCL-NACL 20-0.9 MG/250ML-% IV SOLN
INTRAVENOUS | Status: AC
  Filled 2023-11-10: qty 250

## 2023-11-10 MED ORDER — PROPOFOL 10 MG/ML IV BOLUS
INTRAVENOUS | Status: DC | PRN
  Administered 2023-11-10: 200 mg via INTRAVENOUS

## 2023-11-10 MED ORDER — TRANEXAMIC ACID-NACL 1000-0.7 MG/100ML-% IV SOLN
1000.0000 mg | Freq: Once | INTRAVENOUS | Status: AC
Start: 2023-11-10 — End: 2023-11-10
  Administered 2023-11-10: 1000 mg via INTRAVENOUS
  Filled 2023-11-10: qty 100

## 2023-11-10 MED ORDER — FENTANYL CITRATE (PF) 100 MCG/2ML IJ SOLN
INTRAMUSCULAR | Status: AC
  Filled 2023-11-10: qty 2

## 2023-11-10 MED ORDER — 0.9 % SODIUM CHLORIDE (POUR BTL) OPTIME
TOPICAL | Status: DC | PRN
  Administered 2023-11-10: 1000 mL

## 2023-11-10 MED ORDER — LIDOCAINE 2% (20 MG/ML) 5 ML SYRINGE
INTRAMUSCULAR | Status: DC | PRN
  Administered 2023-11-10: 60 mg via INTRAVENOUS

## 2023-11-10 MED ORDER — LACTATED RINGERS IV SOLN: INTRAVENOUS | Status: DC

## 2023-11-10 MED ORDER — ALUM & MAG HYDROXIDE-SIMETH 200-200-20 MG/5ML PO SUSP: 30.0000 mL | ORAL | Status: DC | PRN

## 2023-11-10 MED ORDER — ACETAMINOPHEN 10 MG/ML IV SOLN
INTRAVENOUS | Status: AC
  Filled 2023-11-10: qty 100

## 2023-11-10 MED ORDER — SODIUM CHLORIDE 0.9 % IR SOLN
Status: DC | PRN
  Administered 2023-11-10: 1000 mL

## 2023-11-10 MED ORDER — ACETAMINOPHEN 325 MG PO TABS
325.0000 mg | ORAL_TABLET | Freq: Four times a day (QID) | ORAL | Status: DC | PRN
  Administered 2023-11-11 – 2023-11-12 (×2): 650 mg via ORAL
  Filled 2023-11-10 (×2): qty 2

## 2023-11-10 MED ORDER — ONDANSETRON HCL 4 MG PO TABS: 4.0000 mg | ORAL_TABLET | Freq: Four times a day (QID) | ORAL | Status: DC | PRN

## 2023-11-10 MED ORDER — PHENYLEPHRINE HCL-NACL 20-0.9 MG/250ML-% IV SOLN
INTRAVENOUS | Status: DC | PRN
  Administered 2023-11-10: 25 ug/min via INTRAVENOUS

## 2023-11-10 MED ORDER — ONDANSETRON HCL 4 MG/2ML IJ SOLN
4.0000 mg | Freq: Four times a day (QID) | INTRAMUSCULAR | Status: DC | PRN
  Administered 2023-11-10: 4 mg via INTRAVENOUS
  Filled 2023-11-10: qty 2

## 2023-11-10 MED ORDER — POVIDONE-IODINE 10 % EX SWAB: 2.0000 | Freq: Once | CUTANEOUS | Status: DC

## 2023-11-10 MED ORDER — ONDANSETRON HCL 4 MG/2ML IJ SOLN: 4.0000 mg | Freq: Once | INTRAMUSCULAR | Status: DC | PRN

## 2023-11-10 MED ORDER — CEFAZOLIN SODIUM-DEXTROSE 2-4 GM/100ML-% IV SOLN
2.0000 g | Freq: Four times a day (QID) | INTRAVENOUS | Status: AC
  Administered 2023-11-10 – 2023-11-11 (×3): 2 g via INTRAVENOUS
  Filled 2023-11-10 (×3): qty 100

## 2023-11-10 MED ORDER — SODIUM CHLORIDE 0.9 % IV SOLN: INTRAVENOUS | Status: DC

## 2023-11-10 MED ORDER — FENTANYL CITRATE (PF) 100 MCG/2ML IJ SOLN
INTRAMUSCULAR | Status: DC | PRN
  Administered 2023-11-10: 100 ug via INTRAVENOUS

## 2023-11-10 MED ORDER — DEXAMETHASONE SODIUM PHOSPHATE 10 MG/ML IJ SOLN
INTRAMUSCULAR | Status: DC | PRN
  Administered 2023-11-10: 10 mg via INTRAVENOUS

## 2023-11-10 MED ORDER — HYDROCODONE-ACETAMINOPHEN 7.5-325 MG PO TABS
1.0000 | ORAL_TABLET | Freq: Every day | ORAL | Status: DC | PRN
  Administered 2023-11-11: 1 via ORAL
  Filled 2023-11-10: qty 1

## 2023-11-10 MED ORDER — AMISULPRIDE (ANTIEMETIC) 5 MG/2ML IV SOLN: 10.0000 mg | Freq: Once | INTRAVENOUS | Status: DC | PRN

## 2023-11-10 MED ORDER — FENTANYL CITRATE PF 50 MCG/ML IJ SOSY: 25.0000 ug | PREFILLED_SYRINGE | INTRAMUSCULAR | Status: DC | PRN

## 2023-11-10 MED ORDER — ROCURONIUM BROMIDE 10 MG/ML (PF) SYRINGE
PREFILLED_SYRINGE | INTRAVENOUS | Status: DC | PRN
  Administered 2023-11-10: 40 mg via INTRAVENOUS
  Administered 2023-11-10: 20 mg via INTRAVENOUS

## 2023-11-10 MED ORDER — HYDROCODONE-ACETAMINOPHEN 5-325 MG PO TABS: 1.0000 | ORAL_TABLET | Freq: Four times a day (QID) | ORAL | 0 refills | Status: AC | PRN

## 2023-11-10 MED ORDER — HYDROMORPHONE HCL 2 MG/ML IJ SOLN
INTRAMUSCULAR | Status: AC
  Filled 2023-11-10: qty 1

## 2023-11-10 MED ORDER — ASPIRIN 325 MG PO TBEC: 325.0000 mg | DELAYED_RELEASE_TABLET | Freq: Every day | ORAL | 0 refills | Status: AC

## 2023-11-10 MED ORDER — BUPIVACAINE-MELOXICAM ER 200-6 MG/7ML IJ SOLN
INTRAMUSCULAR | Status: AC
  Filled 2023-11-10: qty 1

## 2023-11-10 MED ORDER — PHENYLEPHRINE 80 MCG/ML (10ML) SYRINGE FOR IV PUSH (FOR BLOOD PRESSURE SUPPORT)
PREFILLED_SYRINGE | INTRAVENOUS | Status: DC | PRN
  Administered 2023-11-10: 160 ug via INTRAVENOUS

## 2023-11-10 MED ORDER — MENTHOL 3 MG MT LOZG
1.0000 | LOZENGE | OROMUCOSAL | Status: DC | PRN
  Administered 2023-11-10: 3 mg via ORAL
  Filled 2023-11-10: qty 9

## 2023-11-10 MED ORDER — ONDANSETRON HCL 4 MG/2ML IJ SOLN
INTRAMUSCULAR | Status: DC | PRN
  Administered 2023-11-10: 4 mg via INTRAVENOUS

## 2023-11-10 MED ORDER — ISOPROPYL ALCOHOL 70 % SOLN
Status: AC
  Filled 2023-11-10: qty 480

## 2023-11-10 MED ORDER — METHOCARBAMOL 1000 MG/10ML IJ SOLN: 500.0000 mg | Freq: Three times a day (TID) | INTRAMUSCULAR | Status: DC | PRN

## 2023-11-10 MED ORDER — PROPOFOL 10 MG/ML IV BOLUS
INTRAVENOUS | Status: AC
  Filled 2023-11-10: qty 20

## 2023-11-10 MED ORDER — ACETAMINOPHEN 500 MG PO TABS
500.0000 mg | ORAL_TABLET | Freq: Four times a day (QID) | ORAL | Status: AC
Start: 2023-11-10 — End: 2023-11-11
  Administered 2023-11-10 – 2023-11-11 (×3): 500 mg via ORAL
  Filled 2023-11-10 (×3): qty 1

## 2023-11-10 MED ORDER — MORPHINE SULFATE (PF) 2 MG/ML IV SOLN: 0.5000 mg | INTRAVENOUS | Status: DC | PRN

## 2023-11-10 MED ORDER — ISOPROPYL ALCOHOL 70 % SOLN
Status: DC | PRN
  Administered 2023-11-10: 1 via TOPICAL

## 2023-11-10 MED ORDER — ASPIRIN 325 MG PO TBEC
325.0000 mg | DELAYED_RELEASE_TABLET | Freq: Two times a day (BID) | ORAL | Status: DC
  Administered 2023-11-10 – 2023-11-12 (×4): 325 mg via ORAL
  Filled 2023-11-10 (×4): qty 1

## 2023-11-10 MED ORDER — POLYETHYLENE GLYCOL 3350 17 G PO PACK: 17.0000 g | PACK | Freq: Every day | ORAL | Status: DC | PRN

## 2023-11-10 MED ORDER — SUGAMMADEX SODIUM 200 MG/2ML IV SOLN
INTRAVENOUS | Status: DC | PRN
  Administered 2023-11-10: 200 mg via INTRAVENOUS

## 2023-11-10 MED ORDER — BUPIVACAINE-MELOXICAM ER 200-6 MG/7ML IJ SOLN
INTRAMUSCULAR | Status: DC | PRN
  Administered 2023-11-10: 7 mL

## 2023-11-10 MED ORDER — TRANEXAMIC ACID 1000 MG/10ML IV SOLN
INTRAVENOUS | Status: DC | PRN
  Administered 2023-11-10: 2000 mg via TOPICAL

## 2023-11-10 MED ORDER — DEXAMETHASONE SODIUM PHOSPHATE 10 MG/ML IJ SOLN
INTRAMUSCULAR | Status: AC
  Filled 2023-11-10: qty 3

## 2023-11-10 MED ORDER — ACETAMINOPHEN 10 MG/ML IV SOLN: 1000.0000 mg | Freq: Once | INTRAVENOUS | Status: DC | PRN

## 2023-11-10 MED ORDER — HYDROCODONE-ACETAMINOPHEN 5-325 MG PO TABS
1.0000 | ORAL_TABLET | Freq: Two times a day (BID) | ORAL | Status: DC | PRN
  Administered 2023-11-11: 1 via ORAL
  Filled 2023-11-10: qty 1

## 2023-11-10 MED ORDER — CEFAZOLIN SODIUM-DEXTROSE 2-4 GM/100ML-% IV SOLN
2.0000 g | INTRAVENOUS | Status: AC
  Administered 2023-11-10: 2 g via INTRAVENOUS
  Filled 2023-11-10: qty 100

## 2023-11-10 MED ORDER — DOCUSATE SODIUM 100 MG PO CAPS
100.0000 mg | ORAL_CAPSULE | Freq: Two times a day (BID) | ORAL | Status: DC
  Administered 2023-11-10 – 2023-11-12 (×4): 100 mg via ORAL
  Filled 2023-11-10 (×4): qty 1

## 2023-11-10 MED ORDER — METHOCARBAMOL 1000 MG/10ML IJ SOLN
500.0000 mg | Freq: Once | INTRAMUSCULAR | Status: AC
  Administered 2023-11-10: 500 mg via INTRAVENOUS
  Filled 2023-11-10: qty 5

## 2023-11-10 MED ORDER — TRANEXAMIC ACID-NACL 1000-0.7 MG/100ML-% IV SOLN
1000.0000 mg | INTRAVENOUS | Status: AC
  Administered 2023-11-10: 1000 mg via INTRAVENOUS
  Filled 2023-11-10: qty 100

## 2023-11-10 MED ORDER — ONDANSETRON HCL 4 MG/2ML IJ SOLN
INTRAMUSCULAR | Status: AC
  Filled 2023-11-10: qty 6

## 2023-11-10 MED ORDER — ADULT MULTIVITAMIN W/MINERALS CH
1.0000 | ORAL_TABLET | Freq: Every day | ORAL | Status: DC
  Administered 2023-11-11 – 2023-11-12 (×2): 1 via ORAL
  Filled 2023-11-10 (×2): qty 1

## 2023-11-10 MED ORDER — METHOCARBAMOL 500 MG PO TABS
500.0000 mg | ORAL_TABLET | Freq: Three times a day (TID) | ORAL | Status: DC | PRN
  Administered 2023-11-11 – 2023-11-12 (×4): 500 mg via ORAL
  Filled 2023-11-10 (×4): qty 1

## 2023-11-10 MED ORDER — HYDROMORPHONE HCL 1 MG/ML IJ SOLN
INTRAMUSCULAR | Status: DC | PRN
  Administered 2023-11-10 (×2): .5 mg via INTRAVENOUS

## 2023-11-10 MED ORDER — MAGNESIUM CITRATE PO SOLN: 1.0000 | Freq: Once | ORAL | Status: DC | PRN

## 2023-11-10 MED ORDER — MIDAZOLAM HCL 2 MG/2ML IJ SOLN
INTRAMUSCULAR | Status: AC
  Filled 2023-11-10: qty 2

## 2023-11-10 MED ORDER — ENSURE PLUS HIGH PROTEIN PO LIQD
237.0000 mL | ORAL | Status: DC
  Administered 2023-11-11 – 2023-11-12 (×2): 237 mL via ORAL

## 2023-11-10 MED ORDER — VANCOMYCIN HCL 1 G IV SOLR
INTRAVENOUS | Status: DC | PRN
  Administered 2023-11-10: 1000 mg via TOPICAL

## 2023-11-10 MED ORDER — MIDAZOLAM HCL 2 MG/2ML IJ SOLN
INTRAMUSCULAR | Status: DC | PRN
  Administered 2023-11-10: 2 mg via INTRAVENOUS

## 2023-11-10 SURGICAL SUPPLY — 43 items
BAG COUNTER SPONGE SURGICOUNT (BAG) IMPLANT
BAG ZIPLOCK 12X15 (MISCELLANEOUS) ×2 IMPLANT
BLADE SAG 18X100X1.27 (BLADE) ×2 IMPLANT
CLSR STERI-STRIP ANTIMIC 1/2X4 (GAUZE/BANDAGES/DRESSINGS) IMPLANT
COVER PERINEAL POST (MISCELLANEOUS) ×2 IMPLANT
COVER SURGICAL LIGHT HANDLE (MISCELLANEOUS) ×2 IMPLANT
CUP ACETAB EMPH CMTLS 46 3H (Cup) IMPLANT
DRAPE IMP U-DRAPE 54X76 (DRAPES) ×2 IMPLANT
DRAPE POUCH INSTRU U-SHP 10X18 (DRAPES) ×2 IMPLANT
DRAPE STERI IOBAN 125X83 (DRAPES) ×2 IMPLANT
DRAPE TOP 10253 STERILE (DRAPES) ×4 IMPLANT
DRAPE U-SHAPE 47X51 STRL (DRAPES) ×2 IMPLANT
DRESSING AQUACEL AG SP 3.5X6 (GAUZE/BANDAGES/DRESSINGS) ×2 IMPLANT
DRSG AQUACEL AG ADV 3.5X10 (GAUZE/BANDAGES/DRESSINGS) ×2 IMPLANT
DURAPREP 26ML APPLICATOR (WOUND CARE) ×4 IMPLANT
ELECT PENCIL ROCKER SW 15FT (MISCELLANEOUS) ×2 IMPLANT
ELECT REM PT RETURN 15FT ADLT (MISCELLANEOUS) ×2 IMPLANT
GLOVE BIOGEL PI IND STRL 7.0 (GLOVE) ×2 IMPLANT
GLOVE BIOGEL PI IND STRL 7.5 (GLOVE) ×2 IMPLANT
GLOVE ECLIPSE 7.0 STRL STRAW (GLOVE) ×6 IMPLANT
GLOVE SURG SYN 7.5 PF PI (GLOVE) ×4 IMPLANT
GOWN SRG XL LVL 4 BRTHBL STRL (GOWNS) ×4 IMPLANT
HEAD FEMORAL 32 CERAMIC (Hips) IMPLANT
HOOD PEEL AWAY T7 (MISCELLANEOUS) ×6 IMPLANT
KIT TURNOVER KIT A (KITS) ×2 IMPLANT
LINER ACET EMPH NTRL 32X46 +4 (Liner) IMPLANT
MARKER SKIN DUAL TIP RULER LAB (MISCELLANEOUS) ×2 IMPLANT
NDL SPNL 18GX3.5 QUINCKE PK (NEEDLE) ×2 IMPLANT
NEEDLE SPNL 18GX3.5 QUINCKE PK (NEEDLE) ×1 IMPLANT
PACK ANTERIOR HIP CUSTOM (KITS) ×2 IMPLANT
SCREW 6.5MMX25MM (Screw) IMPLANT
SET HNDPC FAN SPRY TIP SCT (DISPOSABLE) ×2 IMPLANT
SOLUTION PRONTOSAN WOUND 350ML (IRRIGATION / IRRIGATOR) ×2 IMPLANT
STAPLER SKIN PROX 35W (STAPLE) IMPLANT
STEM FEM ACTIS STD SZ4 (Stem) IMPLANT
SUT ETHIBOND 2 V 37 (SUTURE) ×2 IMPLANT
SUT MNCRL AB 3-0 PS2 18 (SUTURE) IMPLANT
SUT NYLON 3 0 (SUTURE) IMPLANT
SUT STRATAFIX PDS+ 0 24IN (SUTURE) ×2 IMPLANT
SUT VIC AB 0 CT1 36 (SUTURE) IMPLANT
SUT VIC AB 2-0 CT1 TAPERPNT 27 (SUTURE) ×4 IMPLANT
TRAY CATH INTERMITTENT SS 16FR (CATHETERS) IMPLANT
TUBE SUCTION HIGH CAP CLEAR NV (SUCTIONS) ×2 IMPLANT

## 2023-11-10 NOTE — Op Note (Addendum)
 ARTHROPLASTY, HIP, TOTAL, ANTERIOR APPROACH  Procedure Note TAILEY TOP   [id number]  Pre-op Diagnosis: Right subcapital femoral fracture     Post-op Diagnosis: same  Operative Findings Acute subcapital femoral neck fracture   Operative Procedures  1. Total hip replacement; Right hip; uncemented [id number]   Surgeon: Kay Cummins, M.D.  Assist: None   Anesthesia: general  Prosthesis: Depuy Acetabulum: Emphasys 46 mm Femur: Actis 4 STD Head: 32 mm size: +1 Liner: +4 neutral Bearing Type: ceramic/poly  Total Hip Arthroplasty (Anterior Approach) Op Note:  After informed consent was obtained and the operative extremity marked in the holding area, the patient was brought back to the operating room and placed supine on the HANA table. Next, the operative extremity was prepped and draped in normal sterile fashion. Surgical timeout occurred verifying patient identification, surgical site, surgical procedure and administration of antibiotics.  A 10 cm longitudinal incision was made starting from 2 fingerbreadths lateral and inferior to the ASIS towards the lateral aspect of the patella.  A Hueter approach to the hip was performed, using the interval between tensor fascia lata and sartorius.  Dissection was carried bluntly down onto the anterior hip capsule. The lateral femoral circumflex vessels were identified and coagulated. A capsulotomy was performed and the capsular flaps tagged for later repair.  The neck osteotomy was performed 1 fingerbreadth above the lesser trochanter. The femoral head was removed, the acetabular rim was cleared of soft tissue and attention was turned to reaming the acetabulum.  Sequential reaming was performed under fluoroscopic guidance down to the floor of the cotyloid fossa. We reamed to a size 45 mm, and then impacted the acetabular shell. A 25 mm cancellous screw was placed to secure the shell.  A +4 neutral liner was then placed after irrigation and attention  turned to the femur.  After placing the femoral hook, the leg was taken to externally rotated, extended and adducted position taking care to perform soft tissue releases to allow for adequate mobilization of the femur. Soft tissue was cleared from the shoulder of the greater trochanter and the hook elevator used to improve exposure of the proximal femur.  Lateral bone from the shoulder was rasped away for relief.  Sequential broaching performed up to a size 4.  Standard trial neck and +1 head were placed. The leg was brought back up to neutral and the construct reduced.  The position and sizing of components, offset and leg lengths were checked using fluoroscopy.  Head center to lesser trochanter distance checked.  Based on fluoroscopic findings, we chose to go with a standard neck and +1 head ball.  Stability of the construct was checked in 45 degrees of hip extension and 90 degrees of external rotation without any subluxation, shuck or impingement of prosthesis. We dislocated the prosthesis, dropped the leg back into position, removed trial components, and irrigated copiously. The final stem and head were chosen then placed, the leg brought back up, the system reduced and fluoroscopy used to verify positioning.  Antibiotic irrigation was placed in the surgical wound.   We irrigated, obtained hemostasis and closed the capsule using #2 ethibond suture.  A topical mixture of 0.25% bupivacaine  and meloxicam  was placed deep to the fascia.  One gram of vancomycin  powder was placed in the surgical bed.   One gram of topical tranexamic acid  was injected into the joint.  The fascia was closed with #1 stratafix, the deep fat layer was closed with 0 vicryl, the subcutaneous layers  closed with 2.0 Vicryl Plus and the skin closed with staples. A sterile dressing was applied. The patient was awakened in the operating room and taken to recovery in stable condition.  All sponge, needle, and instrument counts were correct at  the end of the case.   Position: supine  Complications: see description of procedure.  Time Out: performed   Drains/Packing: none  Estimated blood loss: see anesthesia record  Returned to Recovery Room: in good condition.   Antibiotics: yes   Mechanical VTE (DVT) Prophylaxis: sequential compression devices, TED thigh-high  Chemical VTE (DVT) Prophylaxis: aspirin  325 BID x 4 weeks   Fluid Replacement: see anesthesia record  Specimens Removed: 1 to pathology   Sponge and Instrument Count Correct? yes   PACU: portable radiograph - low AP   Plan/RTC: Return in 2 weeks for suture removal. Weight Bearing/Load Lower Extremity: full  Hip precautions: none Suture Removal: 2 weeks   [name], MD Maralee Morita 5:55 PM   Implant Name Type Inv. Item Serial No. Manufacturer Lot No. LRB No. Used Action  CUP ACETAB EMPH CMTLS 46 3H - [id number] Cup CUP ACETAB EMPH CMTLS 46 3H  [employer] [id number] Right 1 Implanted  LINER ACET EMPH NTRL 32X46 +4 - [id number] Liner LINER ACET EMPH NTRL 32X46 +4  [employer] [id number] Right 1 Implanted  SCREW 6.5MMX25MM - [id number] Screw SCREW 6.5MMX25MM  [employer] [id number] Right 1 Implanted  STEM FEM ACTIS STD SZ4 - [id number] Stem STEM FEM ACTIS STD SZ4  [employer] [id number] Right 1 Implanted  HEAD FEMORAL 32 CERAMIC - [id number] Hips HEAD FEMORAL 32 CERAMIC  [employer] [id number] Right 1 Implanted

## 2023-11-10 NOTE — Anesthesia Procedure Notes (Signed)
 Procedure Name: Intubation Date/Time: 11/10/2023 4:19 PM  Performed by: Cena Epps, CRNAPre-anesthesia Checklist: Patient identified, Emergency Drugs available, Suction available and Patient being monitored Patient Re-evaluated:Patient Re-evaluated prior to induction Oxygen Delivery Method: Circle System Utilized Preoxygenation: Pre-oxygenation with 100% oxygen Induction Type: IV induction Ventilation: Mask ventilation without difficulty Laryngoscope Size: Mac, 3 and Glidescope (DL x1, grade 3 view, changed to glidescope, grade 1 view) Grade View: Grade I Tube type: Oral Tube size: 7.0 mm Number of attempts: 1 Airway Equipment and Method: Stylet and Oral airway Placement Confirmation: ETT inserted through vocal cords under direct vision, positive ETCO2 and breath sounds checked- equal and bilateral Secured at: 22 cm Tube secured with: Tape Dental Injury: Teeth and Oropharynx as per pre-operative assessment

## 2023-11-10 NOTE — Discharge Instructions (Signed)

## 2023-11-10 NOTE — Progress Notes (Signed)
 Initial Nutrition Assessment  DOCUMENTATION CODES:   Not applicable  INTERVENTION:  - Recommend Regular diet once able to advance.  - Once diet advanced, recommend Ensure Plus High Protein po BID. Provides 350 kcal and 20 grams of protein. - Add Multivitamin with minerals daily - Monitor weight trends.   NUTRITION DIAGNOSIS:   Increased nutrient needs related to hip fracture as evidenced by estimated needs.  GOAL:   Patient will meet greater than or equal to 90% of their needs  MONITOR:   PO intake, Supplement acceptance, Weight trends  REASON FOR ASSESSMENT:   Consult Hip fracture protocol  ASSESSMENT:   68 y.o. female with without significant medical history who presented with right hip pain after having a fall at home. Admitted for right hip fracture.  Patient reports a UBW of 162# and that her weight has been stable.  She endorses typically consuming 3 meals a day at home. Usually has breakfast at 10am, lunch at 2:30, and dinner at 7pm.  Her and her husband follow a mostly mediterranean diet and usually aim to eat eggs twice weekly, red meat no more than twice weekly, and fish twice weekly. She also notes they usually only eat dessert twice a week.  Was eating normally up until admission.   Patient NPO for OR today. Discussed increased nutrient needs to promote healing after surgery. Patient agreeable to Ensure once daily to support intake once diet advanced.     Medications reviewed and include: Colace  Labs reviewed:  -   Diet Order:   Diet Order             Diet NPO time specified Except for: Sips with Meds  Diet effective midnight                   EDUCATION NEEDS:  Education needs have been addressed  Skin:  Skin Assessment: Reviewed RN Assessment  Last BM:  9/22  Height:  Ht Readings from Last 1 Encounters:  11/09/23 5' 4.5 (1.638 m)   Weight:  Wt Readings from Last 1 Encounters:  11/09/23 74.4 kg    BMI:  Body mass index is  27.72 kg/m.  Estimated Nutritional Needs:  Kcal:  1850-2000 kcals Protein:  80-95 grams Fluid:  >/= 1.9L    Trude Ned RD, LDN Contact via Secure Chat.

## 2023-11-10 NOTE — Anesthesia Postprocedure Evaluation (Signed)
 Anesthesia Post Note  Patient: Sally Le  Procedure(s) Performed: ARTHROPLASTY, HIP, TOTAL, ANTERIOR APPROACH (Right: Hip)     Patient location during evaluation: PACU Anesthesia Type: General Level of consciousness: awake and alert Pain management: pain level controlled Vital Signs Assessment: post-procedure vital signs reviewed and stable Respiratory status: spontaneous breathing, nonlabored ventilation, respiratory function stable and patient connected to nasal cannula oxygen Cardiovascular status: blood pressure returned to baseline and stable Postop Assessment: no apparent nausea or vomiting Anesthetic complications: no   No notable events documented.  Last Vitals:  Vitals:   11/10/23 1830 11/10/23 1858  BP: (!) 178/77 (!) 176/78  Pulse: 77 80  Resp: 16 16  Temp:  36.9 C  SpO2: 94% 96%    Last Pain:  Vitals:   11/10/23 1858  TempSrc: Oral  PainSc:                  [name]

## 2023-11-10 NOTE — TOC Initial Note (Signed)
 Transition of Care [facility]) - Initial/Assessment Note    Patient Details  Name: Sally Le MRN: [id number] Date of Birth: 1955-06-14  [facility] CM/SW Contact:    NORMAN ASPEN, LCSW Phone Number: 11/10/2023, 10:17 AM  Clinical Narrative:                  Met with pt and spouse to introduce CSW role with dc planning needs.  Pt awaiting surgery this afternoon.  Spouse confirms that he is primary caregiver and both hopeful she will be able to return directly home following surgery and PT sessions.  They confirm already having a RW for home use.  No concerns at this time.  Aware CSW will follow up tomorrow after PT eval and recommendations are made.   Expected Discharge Plan: Home w Home Health Services Barriers to Discharge: Continued Medical Work up   Patient Goals and CMS Choice Patient states their goals for this hospitalization and ongoing recovery are:: return home          Expected Discharge Plan and Services In-house Referral: Clinical Social Work     Living arrangements for the past 2 months: Single Family Home                 DME Arranged: N/A DME Agency: NA                  Prior Living Arrangements/Services Living arrangements for the past 2 months: Single Family Home Lives with:: Spouse Patient language and need for interpreter reviewed:: Yes Do you feel safe going back to the place where you live?: Yes      Need for Family Participation in Patient Care: Yes (Comment) Care giver support system in place?: Yes (comment)   Criminal Activity/Legal Involvement Pertinent to Current Situation/Hospitalization: No - Comment as needed  Activities of Daily Living   ADL Screening (condition at time of admission) Independently performs ADLs?: Yes (appropriate for developmental age) Is the patient deaf or have difficulty hearing?: No Does the patient have difficulty seeing, even when wearing glasses/contacts?: No Does the patient have difficulty  concentrating, remembering, or making decisions?: No  Permission Sought/Granted Permission sought to share information with : Family Supports Permission granted to share information with : Yes, Verbal Permission Granted  Share Information with NAME: spouse, Maleta Pacha @ [phone]           Emotional Assessment Appearance:: Appears stated age Attitude/Demeanor/Rapport: Gracious Affect (typically observed): Accepting Orientation: : Oriented to Self, Oriented to Place, Oriented to  Time, Oriented to Situation Alcohol  / Substance Use: Not Applicable Psych Involvement: No (comment)  Admission diagnosis:  Hip fracture (HCC) [S72.009A] Fall, initial encounter [W19.XXXA] Closed displaced fracture of right femoral neck (HCC) [S72.001A] Patient Active Problem List   Diagnosis Date Noted   Closed subcapital fracture of neck of femur, right, initial encounter (HCC) 11/09/2023   Medication management 06/01/2023   Vitamin B12 deficiency 06/01/2023   History of drug allergy 01/11/2018   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 [address] [place] [address] Phone: [phone] Fax: [phone]     Social Drivers of Health (SDOH) Social History: SDOH Screenings   Food Insecurity: No Food Insecurity (11/09/2023)  Housing: High Risk (11/09/2023)  Transportation Needs: No Transportation  Needs (11/09/2023)  Utilities: Not At Risk (11/09/2023)  Depression (PHQ2-9): Low Risk  (06/01/2023)  Social Connections: Moderately Integrated (11/09/2023)  Tobacco Use: Low Risk  (11/09/2023)   SDOH Interventions: Housing Interventions: Intervention Not Indicated   Readmission Risk Interventions    11/10/2023   10:15 AM  Readmission Risk Prevention Plan  Post Dischage Appt  Complete  Medication Screening Complete  Transportation Screening Complete

## 2023-11-10 NOTE — Progress Notes (Signed)
 [facility] PROGRESS NOTE   Sally Le FMW:[id number] DOB: [birth year] DOA: 11/09/2023  PCP: Alvia Corean CROME, FNP  Brief History: 68 y.o. female with without significant medical history comes into the hospital with right hip pain after having a fall at home.  This was a mechanical fall.  Imaging studies revealed right hip fracture.  Patient was hospitalized for further management.    Consultants: Orthopedics  Procedures: Surgery is planned for this afternoon    Subjective/Interval History: Patient mentions pain in the right hip at about 6 out of 10 in intensity.  Worse with movement.  Denies any chest pain shortness of breath.  Denies any history of heart disease.    Assessment/Plan:  Right hip fracture Secondary to mechanical fall.  Plan is for surgery this afternoon.  Patient seems to be stable.    Preoperative evaluation EKG not of great quality but no acute changes noted.  Patient is asymptomatic.  Chest x-ray without any acute findings. Based on the [place] Perioperative Risk Index the patient's estimated risk probability for perioperative myocardial infarction or cardiac arrest is 0.03%. Patient's procedure is intermediate risk. Patient's functional capacity is high. Based on the AHA/ACC algorithms patient may proceed to surgery without further testing.  Not on any beta-blockers prior to admission. Pulmonary toilet. Incentive spirometry post operatively.   DVT Prophylaxis: Lovenox  Code Status: Full code Family Communication: Discussed with the patient Disposition Plan: Surgery later today.  Disposition not clear yet  Status is: Inpatient Remains inpatient appropriate because: Right hip fracture      Medications: Scheduled:  docusate sodium   100 mg Oral BID   enoxaparin  (LOVENOX ) injection  40 mg Subcutaneous Q24H   povidone-iodine   2 Application Topical Once   tranexamic acid  (CYKLOKAPRON ) 2,000 mg in sodium chloride  0.9 % 50 mL Topical  Application  2,000 mg Topical To OR   Continuous:   ceFAZolin  (ANCEF ) IV     tranexamic acid      PRN:HYDROcodone -acetaminophen , HYDROmorphone  (DILAUDID ) injection  Antibiotics: Anti-infectives (From admission, onward)    Start     Dose/Rate Route Frequency Ordered Stop   11/10/23 0600  ceFAZolin  (ANCEF ) IVPB 2g/100 mL premix        2 g 200 mL/hr over 30 Minutes Intravenous On call to O.R. 11/10/23 0505 11/11/23 0559       Objective:  Vital Signs  Vitals:   11/09/23 2119 11/10/23 0218 11/10/23 0610 11/10/23 0854  BP: (!) 152/69 (!) 154/74 (!) 149/71 139/63  Pulse: 82 79 76 80  Resp: 16 19 18 18   Temp: 98.8 F (37.1 C) 97.8 F (36.6 C) 97.7 F (36.5 C) 98.1 F (36.7 C)  TempSrc: Oral Oral  Oral  SpO2: 95% 96% 97% 97%  Weight:      Height:        Intake/Output Summary (Last 24 hours) at 11/10/2023 0912 Last data filed at 11/10/2023 0600 Gross per 24 hour  Intake 360 ml  Output 0 ml  Net 360 ml   Filed Weights   11/09/23 1257  Weight: 74.4 kg    General appearance: Awake alert.  In no distress Resp: Clear to auscultation bilaterally.  Normal effort Cardio: S1-S2 is normal regular.  No S3-S4.  No rubs murmurs or bruit GI: Abdomen is soft.  Nontender nondistended.  Bowel sounds are present normal.  No masses organomegaly Extremities: Right lower extremity is externally rotated. No obvious focal neurological deficits   Lab Results:  Data Reviewed: I have personally reviewed following labs and  reports of the imaging studies  CBC: Recent Labs  Lab 11/09/23 1319 11/10/23 0334  WBC 10.1 10.3  NEUTROABS 8.9*  --   HGB 14.2 13.3  HCT 43.2 41.9  MCV 96.9 97.0  PLT 176 177    Basic Metabolic Panel: Recent Labs  Lab 11/09/23 1406 11/09/23 2107 11/10/23 0334  NA 137  --  138  K 3.9  --  4.1  CL 104  --  105  CO2 22  --  21*  GLUCOSE 119*  --  144*  BUN 16  --  16  CREATININE 0.84  --  0.86  CALCIUM 9.2  --  9.5  MG  --  2.1  --      GFR: Estimated Creatinine Clearance: 61.7 mL/min (by C-G formula based on SCr of 0.86 mg/dL).    Recent Results (from the past 240 hours)  Surgical PCR screen     Status: None   Collection Time: 11/09/23 10:04 PM   Specimen: Nasal Mucosa; Nasal Swab  Result Value Ref Range Status   MRSA, PCR NEGATIVE NEGATIVE Final   Staphylococcus aureus NEGATIVE NEGATIVE Final    Comment: (NOTE) The Xpert SA Assay (FDA approved for NASAL specimens in patients 22 years of age and older), is one component of a comprehensive surveillance program. It is not intended to diagnose infection nor to guide or monitor treatment. Performed at [facility], [address] [place], [place] [address]       Radiology Studies: DG Hip Unilat With Pelvis 2-3 Views Right Result Date: 11/09/2023 CLINICAL DATA:  Right hip pain status post fall. EXAM: DG HIP (WITH OR WITHOUT PELVIS) 2-3V RIGHT COMPARISON:  None Available. FINDINGS: The bones appear adequately mineralized. There is an acute fracture of the right femoral neck which demonstrates up to 1.5 cm of superior displacement. No evidence of pelvic fracture, dislocation or underlying femoral head osteonecrosis. Mild degenerative changes of both hips and sacroiliac joints. IMPRESSION: Acute, moderately displaced fracture of the right femoral neck. Electronically Signed   By: Elsie Perone M.D.   On: 11/09/2023 14:05   DG Chest Port 1 View Result Date: 11/09/2023 CLINICAL DATA:  Witnessed fall.  Right hip pain. EXAM: PORTABLE CHEST 1 VIEW COMPARISON:  None Available. FINDINGS: 1324 hours. The heart size and mediastinal contours are normal. The lungs are clear. There is no pleural effusion or pneumothorax. No acute osseous findings are identified. Mild scoliosis. Telemetry leads overlie the chest. IMPRESSION: No evidence of acute chest injury. Mild scoliosis. Electronically Signed   By: Elsie Perone M.D.   On: 11/09/2023 14:04       LOS: 1  day   Joette Pebbles  [facility] Pager on [email or web address]  11/10/2023, 9:12 AM

## 2023-11-10 NOTE — Transfer of Care (Signed)
 Immediate Anesthesia Transfer of Care Note  Patient: Sally Le  Procedure(s) Performed: ARTHROPLASTY, HIP, TOTAL, ANTERIOR APPROACH (Right: Hip)  Patient Location: PACU  Anesthesia Type:General  Level of Consciousness: drowsy and patient cooperative  Airway & Oxygen Therapy: Patient Spontanous Breathing and Patient connected to face mask oxygen  Post-op Assessment: Report given to RN and Post -op Vital signs reviewed and stable  Post vital signs: Reviewed and stable  Last Vitals:  Vitals Value Taken Time  BP 170/71 11/10/23 18:09  Temp    Pulse 78 11/10/23 18:12  Resp 17 11/10/23 18:12  SpO2 100 % 11/10/23 18:12  Vitals shown include unfiled device data.  Last Pain:  Vitals:   11/10/23 1424  TempSrc: Oral  PainSc: 4       Patients Stated Pain Goal: 2 (11/10/23 1033)  Complications: No notable events documented.

## 2023-11-10 NOTE — H&P (Signed)
 PREOPERATIVE H&P  Chief Complaint: Right hip fracture  HPI: Sally Le is a 68 y.o. female who presents for surgical treatment of Right hip fracture.  She denies any changes in medical history.  Past Surgical History:  Procedure Laterality Date   ADENOIDECTOMY     APPENDECTOMY     DERMOID CYST  EXCISION     TONSILLECTOMY     TONSILLECTOMY     Social History   Socioeconomic History   Marital status: Married    Spouse name: Gladis   Number of children: 0   Years of education: 15   Highest education level: Not on file  Occupational History   Occupation: Magazine features editor: First Theatre manager For [employer]  Tobacco Use   Smoking status: Never   Smokeless tobacco: Never  Vaping Use   Vaping status: Never Used  Substance and Sexual Activity   Alcohol  use: Yes    Alcohol /week: 1.0 standard drink of alcohol     Types: 1 Glasses of wine per week   Drug use: No   Sexual activity: Yes    Partners: Male    Birth control/protection: Post-menopausal  Other Topics Concern   Not on file  Social History Narrative   Exercises 4xweekly.  Lives with her husband.   Social Drivers of Corporate investment banker Strain: Not on file  Food Insecurity: No Food Insecurity (11/09/2023)   Hunger Vital Sign    Worried About Running Out of Food in the Last Year: Never true    Ran Out of Food in the Last Year: Never true  Transportation Needs: No Transportation Needs (11/09/2023)   PRAPARE - Administrator, Civil Service (Medical): No    Lack of Transportation (Non-Medical): No  Physical Activity: Not on file  Stress: Not on file  Social Connections: Moderately Integrated (11/09/2023)   Social Connection and Isolation Panel    Frequency of Communication with Friends and Family: Three times a week    Frequency of Social Gatherings with Friends and Family: Twice a week    Attends Religious Services: Patient declined    Database administrator or Organizations:  Yes    Attends Engineer, structural: More than 4 times per year    Marital Status: Married   Family History  Problem Relation Age of Onset   Parkinson's disease Mother    Breast cancer Mother    Cancer Mother 63       Estrogen and progestin positive breast cancer   Cancer Father        oat cell lung CA with mets to brain   Pancreatic cancer Maternal Grandfather    Cancer Maternal Grandfather        pancreatic   Obesity Sister    Spondylolisthesis Sister    Asthma Sister    Cancer Maternal Aunt 69       lung cancer (non-smoker)   Heart disease Paternal Grandmother    Lung cancer Unknown    Allergies  Allergen Reactions   Adhesive [Tape] Rash and Other (See Comments)    The adhesive, not the tape, is the source of the problem If Coban wrap is used, an underlayment of gauze MUST be first placed.   Cat Dander Other (See Comments)    Sneezing   Sodium Tetradecyl Sulfate Itching and Other (See Comments)    Sodium Tetradecyl Sulfate is a sterile, nonpyrogenic solution for intravenous use that is used in a procedure  called sclerotherapy to treat varicose and spider veins. Terrible itching   Amoxicillin Dermatitis and Rash   Erythromycin Nausea Only   Wound Dressing Adhesive Rash and Other (See Comments)    Band-aids = Sometimes, rashes occur   Prior to Admission medications   Medication Sig Start Date End Date Taking? Authorizing Provider  AMBULATORY NON FORMULARY MEDICATION Take 30 mLs by mouth See admin instructions. Mangosteen juice - Drink 30 ml's by mouth once a day   Yes [provider]  b complex vitamins capsule Take 1 capsule by mouth daily.   Yes [provider]  CALCIUM PO Take 1 tablet by mouth 2 (two) times daily with a meal.   Yes [provider]  CHERRY PO Take 500 mg by mouth daily.   Yes [provider]  Cholecalciferol (VITAMIN D3) 50 MCG (2000 UT) TABS Take 2,000 Units by mouth daily.   Yes [provider]   ELDERBERRY PO Take 3-4 g by mouth daily.   Yes [provider]  Fluocinolone  Acetonide (DERMOTIC ) 0.01 % OIL Place 3 drops in ear(s) once a week. Patient taking differently: Place 3 drops into both ears every Monday. 06/22/23  Yes Patel, Kunjan B, MD  LUTEIN PO Take 1 tablet by mouth 2 (two) times a week.   Yes [provider]  MAGNESIUM  PO Take 30 mg by mouth daily with lunch.   Yes [provider]  Menaquinone-7 (VITAMIN K2 PO) Take 1 capsule by mouth daily.   Yes [provider]  nystatin-triamcinolone ointment (MYCOLOG) Apply 1 Application topically See admin instructions. Apply to both ears every Monday   Yes [provider]  Omega-3 Fatty Acids (FISH OIL) 1200 MG CAPS Take 1,200 mg by mouth 3 (three) times a week.   Yes [provider]  Soft Lens Products (REWETTING DROPS) SOLN Place 1 drop into both eyes 3 (three) times daily as needed (for dryness).   Yes [provider]  UNABLE TO FIND Vein Formula + Bioflavonoids capsules - Take 1 capsule by mouth three times a week   Yes [provider]     Positive ROS: All other systems have been reviewed and were otherwise negative with the exception of those mentioned in the HPI and as above.  Physical Exam: General: Alert, no acute distress Cardiovascular: No pedal edema Respiratory: No cyanosis, no use of accessory musculature GI: abdomen soft Skin: No lesions in the area of chief complaint Neurologic: Sensation intact distally Psychiatric: Patient is competent for consent with normal mood and affect Lymphatic: no lymphedema  MUSCULOSKELETAL: exam stable  Assessment: Right hip fracture  Plan: Plan for Procedure(s): ARTHROPLASTY, HIP, TOTAL, ANTERIOR APPROACH  The risks benefits and alternatives were discussed with the patient including but not limited to the risks of nonoperative treatment, versus surgical intervention including infection, bleeding, nerve  injury,  blood clots, cardiopulmonary complications, morbidity, mortality, among others, and they were willing to proceed.   Ozell Cummins, MD 11/10/2023 12:24 PM

## 2023-11-11 ENCOUNTER — Encounter (HOSPITAL_COMMUNITY): Payer: Self-pay | Admitting: Orthopaedic Surgery

## 2023-11-11 DIAGNOSIS — S72011A Unspecified intracapsular fracture of right femur, initial encounter for closed fracture: Secondary | ICD-10-CM | POA: Diagnosis not present

## 2023-11-11 LAB — CBC
HCT: 38.4 % (ref 36.0–46.0)
Hemoglobin: 12.2 g/dL (ref 12.0–15.0)
MCH: 31 pg (ref 26.0–34.0)
MCHC: 31.8 g/dL (ref 30.0–36.0)
MCV: 97.5 fL (ref 80.0–100.0)
Platelets: 152 K/uL (ref 150–400)
RBC: 3.94 MIL/uL (ref 3.87–5.11)
RDW: 14.1 % (ref 11.5–15.5)
WBC: 11.5 K/uL — ABNORMAL HIGH (ref 4.0–10.5)
nRBC: 0 % (ref 0.0–0.2)

## 2023-11-11 LAB — BASIC METABOLIC PANEL WITH GFR
Anion gap: 12 (ref 5–15)
BUN: 17 mg/dL (ref 8–23)
CO2: 21 mmol/L — ABNORMAL LOW (ref 22–32)
Calcium: 9.4 mg/dL (ref 8.9–10.3)
Chloride: 105 mmol/L (ref 98–111)
Creatinine, Ser: 0.84 mg/dL (ref 0.44–1.00)
GFR, Estimated: >60 mL/min (ref >60)
Glucose, Bld: 130 mg/dL — ABNORMAL HIGH (ref 70–99)
Potassium: 4.4 mmol/L (ref 3.5–5.1)
Sodium: 138 mmol/L (ref 135–145)

## 2023-11-11 LAB — MAGNESIUM: Magnesium: 2 mg/dL (ref 1.7–2.4)

## 2023-11-11 LAB — PHOSPHORUS: Phosphorus: 3.3 mg/dL (ref 2.5–4.6)

## 2023-11-11 NOTE — Evaluation (Signed)
 Occupational Therapy Evaluation Patient Details Name: Sally Le MRN: [id number] DOB: June 05, 1955 Today's Date: 11/11/2023   History of Present Illness   68 y.o. female with without significant medical history comes into the hospital with right hip pain after having a fall at home. Imaging studies revealed right hip fracture. Patient was hospitalized for further management and underwent total hip replacement of right hip on 9/23     Clinical Impressions Pt presents with decline in function and safety with ADLs and ADL mobility with impaired balance and endurance. PTA pt lives with her husband and was Ind with ADLs, IADLs, drives, home mgt, no AD for mobility. Pt currently requires mod A with Lb ADLs and CGA with mobility/transfers using RW. OT will follow acutely to maximize level of function and safety     If plan is discharge home, recommend the following:   A lot of help with bathing/dressing/bathroom;A little help with walking and/or transfers;Assistance with cooking/housework;Assist for transportation;Help with stairs or ramp for entrance     Functional Status Assessment   Patient has had a recent decline in their functional status and demonstrates the ability to make significant improvements in function in a reasonable and predictable amount of time.     Equipment Recommendations   Other (comment) (RW, pt has raised toilet seat and shower chair at home)     Recommendations for Other Services         Precautions/Restrictions   Precautions Precautions: Fall Restrictions Weight Bearing Restrictions Per Provider Order: No RLE Weight Bearing Per Provider Order: Weight bearing as tolerated     Mobility Bed Mobility Overal bed mobility: Needs Assistance Bed Mobility: Supine to Sit     Supine to sit: Min assist, HOB elevated, Used rails     General bed mobility comments: min A with LE mgt    Transfers Overall transfer level: Needs assistance Equipment  used: Rolling walker (2 wheels) Transfers: Sit to/from Stand Sit to Stand: Contact guard assist           General transfer comment: Cues for safety, technique, hand placement.      Balance Overall balance assessment: Needs assistance Sitting-balance support: No upper extremity supported, Feet supported Sitting balance-Leahy Scale: Good     Standing balance support: During functional activity, Reliant on assistive device for balance Standing balance-Leahy Scale: Fair                             ADL either performed or assessed with clinical judgement   ADL Overall ADL's : Needs assistance/impaired Eating/Feeding: Independent   Grooming: Wash/dry hands;Wash/dry face;Contact guard assist;Standing   Upper Body Bathing: Set up Upper Body Bathing Details (indicate cue type and reason): simulated Lower Body Bathing: Moderate assistance   Upper Body Dressing : Set up   Lower Body Dressing: Moderate assistance;Sitting/lateral leans;Sit to/from stand   Toilet Transfer: Contact guard assist;Ambulation;Rolling walker (2 wheels);BSC/3in1;Cueing for safety   Toileting- Clothing Manipulation and Hygiene: Minimal assistance;Sit to/from stand       Functional mobility during ADLs: Contact guard assist;Rolling walker (2 wheels);Cueing for safety General ADL Comments: pt and spouse educated on ADL A/E for home use with handout provided     Vision Baseline Vision/History: 1 Wears glasses Ability to See in Adequate Light: 0 Adequate Patient Visual Report: No change from baseline       Perception         Praxis  Pertinent Vitals/Pain Pain Assessment Pain Assessment: Faces Pain Score: 3  Faces Pain Scale: Hurts little more Pain Location: R thigh Pain Descriptors / Indicators: Burning Pain Intervention(s): Monitored during session, Premedicated before session, Repositioned, Ice applied     Extremity/Trunk Assessment Upper Extremity Assessment Upper  Extremity Assessment: Overall WFL for tasks assessed   Lower Extremity Assessment Lower Extremity Assessment: Defer to PT evaluation   Cervical / Trunk Assessment Cervical / Trunk Assessment: Normal   Communication Communication Communication: No apparent difficulties   Cognition Arousal: Alert Behavior During Therapy: WFL for tasks assessed/performed                                 Following commands: Intact       Cueing  General Comments   Cueing Techniques: Verbal cues      Exercises     Shoulder Instructions      Home Living Family/patient expects to be discharged to:: Private residence Living Arrangements: Spouse/significant other Available Help at Discharge: Family;Available 24 hours/day Type of Home: House Home Access: Stairs to enter [employer] of Steps: 2-3 Entrance Stairs-Rails: None Home Layout: Two level Alternate Level Stairs-Number of Steps: flight   Bathroom Shower/Tub: Tub/shower unit;Walk-in shower   Bathroom Toilet: Standard     Home Equipment: Educational psychologist (2 wheels);Shower seat - built in          Prior Functioning/Environment Prior Level of Function : Independent/Modified Independent;Driving             Mobility Comments: Ind, no AD needed ADLs Comments: Ind with ADLs, IADLs, home mgt    OT Problem List: Impaired balance (sitting and/or standing);Decreased activity tolerance;Pain;Decreased knowledge of use of DME or AE   OT Treatment/Interventions: Self-care/ADL training;Patient/family education;Balance training;Therapeutic activities;DME and/or AE instruction      OT Goals(Current goals can be found in the care plan section)   Acute Rehab OT Goals Patient Stated Goal: go home OT Goal Formulation: With patient/family Time For Goal Achievement: 11/25/23 Potential to Achieve Goals: Good ADL Goals Pt Will Perform Grooming: with supervision;with set-up;standing;with caregiver  independent in assisting Pt Will Perform Lower Body Bathing: with min assist;with contact guard assist;with caregiver independent in assisting Pt Will Perform Lower Body Dressing: with min assist;with contact guard assist;with caregiver independent in assisting Pt Will Transfer to Toilet: with supervision;with modified independence;ambulating Pt Will Perform Toileting - Clothing Manipulation and hygiene: with contact guard assist;with supervision;with modified independence;sitting/lateral leans;sit to/from stand;with caregiver independent in assisting   OT Frequency:  Min 2X/week    Co-evaluation              AM-PAC OT 6 Clicks Daily Activity     Outcome Measure Help from another person eating meals?: None Help from another person taking care of personal grooming?: A Little Help from another person toileting, which includes using toliet, bedpan, or urinal?: A Little Help from another person bathing (including washing, rinsing, drying)?: A Lot Help from another person to put on and taking off regular upper body clothing?: A Little Help from another person to put on and taking off regular lower body clothing?: A Lot 6 Click Score: 17   End of Session Equipment Utilized During Treatment: Gait belt;Rolling walker (2 wheels);Other (comment) [facility]) Nurse Communication: Mobility status  Activity Tolerance: Patient tolerated treatment well Patient left: in chair;with call bell/phone within reach;with family/visitor present  OT Visit Diagnosis: Unsteadiness on feet (R26.81);Other abnormalities of gait  and mobility (R26.89);Muscle weakness (generalized) (M62.81)                Time: 9051-8955 OT Time Calculation (min): 56 min Charges:  OT General Charges $OT Visit: 1 Visit OT Evaluation $OT Eval Low Complexity: 1 Low OT Treatments $Self Care/Home Management : 8-22 mins $Therapeutic Activity: 23-37 mins    Jacques Karna Loose 11/11/2023, 1:54 PM

## 2023-11-11 NOTE — Progress Notes (Signed)
 Subjective: 1 Day Post-Op Procedure(s) (LRB): ARTHROPLASTY, HIP, TOTAL, ANTERIOR APPROACH (Right) Patient reports pain as mild.    Objective: Vital signs in last 24 hours: Temp:  [97.9 F (36.6 C)-98.6 F (37 C)] 98.6 F (37 C) (09/24 0421) Pulse Rate:  [72-80] 72 (09/24 0421) Resp:  [11-20] 14 (09/24 0421) BP: (139-178)/(63-78) 148/73 (09/24 0421) SpO2:  [92 %-100 %] 99 % (09/24 0421)  Intake/Output from previous day: 09/23 0701 - 09/24 0700 In: 2620 [P.O.:420; I.V.:1225; IV Piggyback:450] Out: 1650 [Urine:1500; Blood:150] Intake/Output this shift: Total I/O In: 1370 [P.O.:420; I.V.:225; Other:525; IV Piggyback:200] Out: 1200 [Urine:1200]  Recent Labs    11/09/23 1319 11/10/23 0334 11/11/23 0334  HGB 14.2 13.3 12.2   Recent Labs    11/10/23 0334 11/11/23 0334  WBC 10.3 11.5*  RBC 4.32 3.94  HCT 41.9 38.4  PLT 177 152   Recent Labs    11/10/23 0334 11/11/23 0334  NA 138 138  K 4.1 4.4  CL 105 105  CO2 21* 21*  BUN 16 17  CREATININE 0.86 0.84  GLUCOSE 144* 130*  CALCIUM 9.5 9.4   No results for input(s): LABPT, INR in the last 72 hours.  Neurologically intact Neurovascular intact Sensation intact distally Intact pulses distally Dorsiflexion/Plantar flexion intact Incision: scant drainage No cellulitis present Compartment soft   Assessment/Plan: 1 Day Post-Op Procedure(s) (LRB): ARTHROPLASTY, HIP, TOTAL, ANTERIOR APPROACH (Right) Up with therapy WBAT RLE ASA/SCDs for dvt ppx-rx on chart Norco for pain control- rx on chart F/u with ortho two weeks post-op      Ronal LITTIE Grave 11/11/2023, 6:58 AM

## 2023-11-11 NOTE — Plan of Care (Signed)
  Problem: Education: Goal: Knowledge of General Education information will improve Description: Including pain rating scale, medication(s)/side effects and non-pharmacologic comfort measures Outcome: Adequate for Discharge   Problem: Health Behavior/Discharge Planning: Goal: Ability to manage health-related needs will improve Outcome: Adequate for Discharge   Problem: Clinical Measurements: Goal: Ability to maintain clinical measurements within normal limits will improve Outcome: Progressing Goal: Will remain free from infection Outcome: Progressing Goal: Diagnostic test results will improve Outcome: Adequate for Discharge Goal: Respiratory complications will improve Outcome: Progressing Goal: Cardiovascular complication will be avoided Outcome: Progressing   Problem: Activity: Goal: Risk for activity intolerance will decrease Outcome: Adequate for Discharge   Problem: Nutrition: Goal: Adequate nutrition will be maintained Outcome: Completed/Met   Problem: Coping: Goal: Level of anxiety will decrease Outcome: Progressing   Problem: Elimination: Goal: Will not experience complications related to bowel motility Outcome: Progressing Goal: Will not experience complications related to urinary retention Outcome: Completed/Met   Problem: Pain Managment: Goal: General experience of comfort will improve and/or be controlled Outcome: Progressing   Problem: Safety: Goal: Ability to remain free from injury will improve Outcome: Progressing   Problem: Skin Integrity: Goal: Risk for impaired skin integrity will decrease Outcome: Adequate for Discharge   Problem: Education: Goal: Verbalization of understanding the information provided (i.e., activity precautions, restrictions, etc) will improve Outcome: Adequate for Discharge Goal: Individualized Educational Video(s) Outcome: Completed/Met   Problem: Activity: Goal: Ability to ambulate and perform ADLs will  improve Outcome: Adequate for Discharge   Problem: Clinical Measurements: Goal: Postoperative complications will be avoided or minimized Outcome: Progressing   Problem: Self-Concept: Goal: Ability to maintain and perform role responsibilities to the fullest extent possible will improve Outcome: Adequate for Discharge   Problem: Pain Management: Goal: Pain level will decrease Outcome: Adequate for Discharge

## 2023-11-11 NOTE — Evaluation (Signed)
 Physical Therapy Evaluation Patient Details Name: Sally Le MRN: [id number] DOB: 1956/02/01 Today's Date: 11/11/2023  History of Present Illness  68 y.o. female with without significant medical history comes into the hospital with right hip pain after having a fall at home. Imaging studies revealed right hip fracture. 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 She ambulated ~175 feet with a RW. Reports burning pain in thigh with activity-reports pain as controlled. Performred 10 reps each of standing exercises-pt tolerated well. Encouraged pt to sit up as tolerated. Will follow pt during hospital stay. Plan is for potential d/c home on tomorrow. Recommend HHPT.        If plan is discharge home, recommend the following: A little help with walking and/or transfers;A little help with bathing/dressing/bathroom;Assistance with cooking/housework;Assist for transportation;Help with stairs or ramp for entrance   Can travel by private vehicle        Equipment Recommendations None recommended by PT  Recommendations for Other Services       Functional Status Assessment Patient has had a recent decline in their functional status and demonstrates the ability to make significant improvements in function in a reasonable and predictable amount of time.     Precautions / Restrictions Precautions Precautions: Fall Restrictions Weight Bearing Restrictions Per Provider Order: No RLE Weight Bearing Per Provider Order: Weight bearing as tolerated      Mobility  Bed Mobility               General bed mobility comments: oob in recliner    Transfers Overall transfer level: Needs assistance Equipment used: Rolling walker (2 wheels) Transfers: Sit to/from Stand Sit to Stand: Contact guard assist           General transfer comment: Cues for safety, technique, hand  placement.    Ambulation/Gait Ambulation/Gait assistance: Contact guard assist Gait Distance (Feet): 175 Feet Assistive device: Rolling walker (2 wheels) Gait Pattern/deviations: Step-through pattern, Decreased stride length       General Gait Details: Cues for safety, sequencing. Able to progress to step thru patterns as distance increased. Denied dizziness.  Stairs            Wheelchair Mobility     Tilt Bed    Modified Rankin (Stroke Patients Only)       Balance Overall balance assessment: Needs assistance         Standing balance support: During functional activity, Reliant on assistive device for balance Standing balance-Leahy Scale: Fair                               Pertinent Vitals/Pain Pain Assessment Pain Assessment: Faces Faces Pain Scale: Hurts little more Pain Location: R thigh Pain Descriptors / Indicators: Burning Pain Intervention(s): Monitored during session, Ice applied, Repositioned    Home Living Family/patient expects to be discharged to:: Private residence Living Arrangements: Spouse/significant other Available Help at Discharge: Family;Available 24 hours/day Type of Home: House Home Access: Stairs to enter Entrance Stairs-Rails: None Entrance Stairs-Number of Steps: 2-3 Alternate Level Stairs-Number of Steps: flight Home Layout: Two level Home Equipment: Educational psychologist (2 wheels)      Prior Function Prior Level of Function : Independent/Modified Independent;Driving             Mobility Comments: Ind, no AD needed ADLs Comments: Ind with ADLs, IADLs,  home mgt     Extremity/Trunk Assessment   Upper Extremity Assessment Upper Extremity Assessment: Defer to OT evaluation    Lower Extremity Assessment Lower Extremity Assessment: Generalized weakness    Cervical / Trunk Assessment Cervical / Trunk Assessment: Normal  Communication   Communication Communication: No apparent difficulties     Cognition Arousal: Alert Behavior During Therapy: WFL for tasks assessed/performed   PT - Cognitive impairments: No apparent impairments                         Following commands: Intact       Cueing Cueing Techniques: Verbal cues     General Comments      Exercises Total Joint Exercises Hip ABduction/ADduction: AROM, Right, 10 reps, Standing Knee Flexion: AROM, Right, 10 reps, Standing Marching in Standing: AROM, Both, 10 reps, Standing General Exercises - Lower Extremity Heel Raises: AROM, Both, 10 reps, Standing   Assessment/Plan    PT Assessment Patient needs continued PT services  PT Problem List Decreased strength;Decreased range of motion;Decreased activity tolerance;Decreased balance;Decreased mobility;Decreased knowledge of use of DME;Pain       PT Treatment Interventions DME instruction;Gait training;Stair training;Functional mobility training;Therapeutic activities;Therapeutic exercise;Patient/family education;Balance training    PT Goals (Current goals can be found in the Care Plan section)  Acute Rehab PT Goals Patient Stated Goal: regain plof/independence PT Goal Formulation: With patient Time For Goal Achievement: 11/25/23 Potential to Achieve Goals: Good    Frequency Min 6X/week     Co-evaluation               AM-PAC PT 6 Clicks Mobility  Outcome Measure Help needed turning from your back to your side while in a flat bed without using bedrails?: A Little Help needed moving from lying on your back to sitting on the side of a flat bed without using bedrails?: A Little Help needed moving to and from a bed to a chair (including a wheelchair)?: A Little Help needed standing up from a chair using your arms (e.g., wheelchair or bedside chair)?: A Little Help needed to walk in hospital room?: A Little Help needed climbing 3-5 steps with a railing? : A Little 6 Click Score: 18    End of Session Equipment Utilized During Treatment:  Gait belt Activity Tolerance: Patient tolerated treatment well Patient left: in chair;with call bell/phone within reach   PT Visit Diagnosis: Pain;History of falling (Z91.81);Other abnormalities of gait and mobility (R26.89) Pain - Right/Left: Right Pain - part of body: Hip;Leg    Time: 8871-8848 PT Time Calculation (min) (ACUTE ONLY): 23 min   Charges:   PT Evaluation $PT Eval Low Complexity: 1 Low PT Treatments $Gait Training: 8-22 mins PT General Charges $$ ACUTE PT VISIT: 1 Visit           Dannial SQUIBB, PT Acute Rehabilitation  Office: [phone]

## 2023-11-11 NOTE — Progress Notes (Signed)
 [facility] PROGRESS NOTE   Sally Le FMW:[id number] DOB: [birth year] DOA: 11/09/2023  PCP: Alvia Corean CROME, FNP  Brief History: 68 y.o. female with without significant medical history comes into the hospital with right hip pain after having a fall at home.  This was a mechanical fall.  Imaging studies revealed right hip fracture.  Patient was hospitalized for further management.    Consultants: Orthopedics  Procedures: Underwent total hip replacement of right hip on 9/23    Subjective/Interval History: Patient mentions that surgery went well.  The pain is very well-controlled.  She has not really been out of the bed yet.  Denies any chest pain shortness of breath.  No nausea vomiting.    Assessment/Plan:  Right hip fracture Secondary to mechanical fall.  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  Patient seems to be stable.  Await PT eval.  Labs are noted to be stable this morning.  DVT Prophylaxis: Lovenox  Code Status: Full code Family Communication: Discussed with the patient Disposition Plan: PT and OT eval     Medications: Scheduled:  acetaminophen   500 mg Oral Q6H   aspirin  EC  325 mg Oral BID   docusate sodium   100 mg Oral BID   feeding supplement  237 mL Oral Q24H   multivitamin with minerals  1 tablet Oral Daily   Continuous:   ceFAZolin  (ANCEF ) IV 2 g (11/11/23 0550)   PRN:acetaminophen , alum & mag hydroxide-simeth, HYDROcodone -acetaminophen , HYDROcodone -acetaminophen , magnesium  citrate, menthol  **OR** phenol, methocarbamol  **OR** methocarbamol  (ROBAXIN ) injection, morphine  injection, ondansetron  **OR** ondansetron  (ZOFRAN ) IV, polyethylene glycol, sorbitol   Antibiotics: Anti-infectives (From admission, onward)    Start     Dose/Rate Route Frequency Ordered Stop   11/10/23 2200  ceFAZolin  (ANCEF ) IVPB 2g/100 mL premix        2 g 200 mL/hr over 30 Minutes Intravenous Every 6 hours 11/10/23 1856 11/11/23 1559   11/10/23 1659  vancomycin   (VANCOCIN ) powder  Status:  Discontinued          As needed 11/10/23 1659 11/10/23 1805   11/10/23 0600  ceFAZolin  (ANCEF ) IVPB 2g/100 mL premix        2 g 200 mL/hr over 30 Minutes Intravenous On call to O.R. 11/10/23 0505 11/10/23 1621       Objective:  Vital Signs  Vitals:   11/10/23 1858 11/10/23 2122 11/11/23 0147 11/11/23 0421  BP: (!) 176/78 (!) 148/69 (!) 145/75 (!) 148/73  Pulse: 80 72 72 72  Resp: 16 20 14 14   Temp: 98.4 F (36.9 C) 97.9 F (36.6 C) 98.2 F (36.8 C) 98.6 F (37 C)  TempSrc: Oral Oral Oral Oral  SpO2: 96% 98% 98% 99%  Weight:      Height:        Intake/Output Summary (Last 24 hours) at 11/11/2023 0910 Last data filed at 11/11/2023 0600 Gross per 24 hour  Intake 2620 ml  Output 1650 ml  Net 970 ml   Filed Weights   11/09/23 1257  Weight: 74.4 kg   General appearance: Awake alert.  In no distress Resp: Clear to auscultation bilaterally.  Normal effort Cardio: S1-S2 is normal regular.  No S3-S4.  No rubs murmurs or bruit GI: Abdomen is soft.  Nontender nondistended.  Bowel sounds are present normal.  No masses organomegaly Able to wiggle her toes and move her feet.  Lab Results:  Data Reviewed: I have personally reviewed following labs and reports of the imaging studies  CBC: Recent Labs  Lab 11/09/23 1319  11/10/23 0334 11/11/23 0334  WBC 10.1 10.3 11.5*  NEUTROABS 8.9*  --   --   HGB 14.2 13.3 12.2  HCT 43.2 41.9 38.4  MCV 96.9 97.0 97.5  PLT 176 177 152    Basic Metabolic Panel: Recent Labs  Lab 11/09/23 1406 11/09/23 2107 11/10/23 0334 11/11/23 0334  NA 137  --  138 138  K 3.9  --  4.1 4.4  CL 104  --  105 105  CO2 22  --  21* 21*  GLUCOSE 119*  --  144* 130*  BUN 16  --  16 17  CREATININE 0.84  --  0.86 0.84  CALCIUM 9.2  --  9.5 9.4  MG  --  2.1  --  2.0  PHOS  --   --   --  3.3    GFR: Estimated Creatinine Clearance: 63.2 mL/min (by C-G formula based on SCr of 0.84 mg/dL).    Recent Results (from the  past 240 hours)  Surgical PCR screen     Status: None   Collection Time: 11/09/23 10:04 PM   Specimen: Nasal Mucosa; Nasal Swab  Result Value Ref Range Status   MRSA, PCR NEGATIVE NEGATIVE Final   Staphylococcus aureus NEGATIVE NEGATIVE Final    Comment: (NOTE) The Xpert SA Assay (FDA approved for NASAL specimens in patients 63 years of age and older), is one component of a comprehensive surveillance program. It is not intended to diagnose infection nor to guide or monitor treatment. Performed at [facility], [address], [place], [place] [address]       Radiology Studies: DG Pelvis Portable Result Date: 11/10/2023 CLINICAL DATA:  Postop EXAM: PORTABLE PELVIS 1-2 VIEWS COMPARISON:  11/09/2023 FINDINGS: Changes of right hip replacement. Normal AP alignment. No hardware complicating feature. IMPRESSION: Right hip replacement.  No visible complicating feature. Electronically Signed   By: Franky Crease M.D.   On: 11/10/2023 20:38   DG HIP UNILAT WITH PELVIS 1V RIGHT Result Date: 11/10/2023 CLINICAL DATA:  Internal fixation EXAM: DG HIP (WITH OR WITHOUT PELVIS) 1V RIGHT COMPARISON:  11/09/2023 FINDINGS: Multiple intraoperative spot images demonstrate changes of right hip replacement. No hardware complicating feature. Normal AP alignment. IMPRESSION: Right hip replacement without visible complicating feature. Electronically Signed   By: Franky Crease M.D.   On: 11/10/2023 20:38   DG C-Arm 1-60 Min-No Report Result Date: 11/10/2023 Fluoroscopy was utilized by the requesting physician.  No radiographic interpretation.   DG C-Arm 1-60 Min-No Report Result Date: 11/10/2023 Fluoroscopy was utilized by the requesting physician.  No radiographic interpretation.   DG Hip Unilat With Pelvis 2-3 Views Right Result Date: 11/09/2023 CLINICAL DATA:  Right hip pain status post fall. EXAM: DG HIP (WITH OR WITHOUT PELVIS) 2-3V RIGHT COMPARISON:  None Available. FINDINGS: The bones  appear adequately mineralized. There is an acute fracture of the right femoral neck which demonstrates up to 1.5 cm of superior displacement. No evidence of pelvic fracture, dislocation or underlying femoral head osteonecrosis. Mild degenerative changes of both hips and sacroiliac joints. IMPRESSION: Acute, moderately displaced fracture of the right femoral neck. Electronically Signed   By: Elsie Perone M.D.   On: 11/09/2023 14:05   DG Chest Port 1 View Result Date: 11/09/2023 CLINICAL DATA:  Witnessed fall.  Right hip pain. EXAM: PORTABLE CHEST 1 VIEW COMPARISON:  None Available. FINDINGS: 1324 hours. The heart size and mediastinal contours are normal. The lungs are clear. There is no pleural effusion or pneumothorax. No  acute osseous findings are identified. Mild scoliosis. Telemetry leads overlie the chest. IMPRESSION: No evidence of acute chest injury. Mild scoliosis. Electronically Signed   By: Elsie Perone M.D.   On: 11/09/2023 14:04       LOS: 2 days   Sally Le  [facility] Pager on [email or web address]  11/11/2023, 9:10 AM

## 2023-11-11 NOTE — TOC Transition Note (Signed)
 Transition of Care [facility]) - Discharge Note   Patient Details  Name: Sally Le MRN: [id number] Date of Birth: [birth year]  [facility] CM/SW Contact:  NORMAN ASPEN, LCSW Phone Number: 11/11/2023, 2:33 PM   Clinical Narrative:     PT has recommended HHPT follow up and pt agreeable.  No agency preference.  Able to secure coverage with [facility].  Already has needed DME.  No further IP CM needs.  Will sign off.    Barriers to Discharge: Barriers Resolved   Patient Goals and CMS Choice Patient states their goals for this hospitalization and ongoing recovery are:: return home          Discharge Placement                       Discharge Plan and Services Additional resources added to the After Visit Summary for   In-house Referral: Clinical Social Work              DME Arranged: N/A DME Agency: NA       HH Arranged: PT HH Agency: [facility] Date HH Agency Contacted: 11/11/23 Time HH Agency Contacted: 1432 Representative spoke with at [facility] Agency: Amy Hyatt  Social Drivers of Health (SDOH) Interventions SDOH Screenings   Food Insecurity: No Food Insecurity (11/09/2023)  Housing: High Risk (11/09/2023)  Transportation Needs: No Transportation Needs (11/09/2023)  Utilities: Not At Risk (11/09/2023)  Depression (PHQ2-9): Low Risk  (06/01/2023)  Social Connections: Moderately Integrated (11/09/2023)  Tobacco Use: Low Risk  (11/10/2023)     Readmission Risk Interventions    11/10/2023   10:15 AM  Readmission Risk Prevention Plan  Post Dischage Appt Complete  Medication Screening Complete  Transportation Screening Complete

## 2023-11-12 LAB — CBC
HCT: 32.5 % — ABNORMAL LOW (ref 36.0–46.0)
Hemoglobin: 10.5 g/dL — ABNORMAL LOW (ref 12.0–15.0)
MCH: 31.3 pg (ref 26.0–34.0)
MCHC: 32.3 g/dL (ref 30.0–36.0)
MCV: 97 fL (ref 80.0–100.0)
Platelets: 126 K/uL — ABNORMAL LOW (ref 150–400)
RBC: 3.35 MIL/uL — ABNORMAL LOW (ref 3.87–5.11)
RDW: 14.2 % (ref 11.5–15.5)
WBC: 8.5 K/uL (ref 4.0–10.5)
nRBC: 0 % (ref 0.0–0.2)

## 2023-11-12 LAB — MAGNESIUM: Magnesium: 2.1 mg/dL (ref 1.7–2.4)

## 2023-11-12 LAB — BASIC METABOLIC PANEL WITH GFR
Anion gap: 10 (ref 5–15)
BUN: 22 mg/dL (ref 8–23)
CO2: 23 mmol/L (ref 22–32)
Calcium: 8.7 mg/dL — ABNORMAL LOW (ref 8.9–10.3)
Chloride: 109 mmol/L (ref 98–111)
Creatinine, Ser: 0.93 mg/dL (ref 0.44–1.00)
GFR, Estimated: >60 mL/min (ref >60)
Glucose, Bld: 105 mg/dL — ABNORMAL HIGH (ref 70–99)
Potassium: 4.3 mmol/L (ref 3.5–5.1)
Sodium: 142 mmol/L (ref 135–145)

## 2023-11-12 MED ORDER — DOCUSATE SODIUM 100 MG PO CAPS: 100.0000 mg | ORAL_CAPSULE | Freq: Two times a day (BID) | ORAL | 0 refills | Status: AC

## 2023-11-12 MED ORDER — ACETAMINOPHEN 325 MG PO TABS: 650.0000 mg | ORAL_TABLET | Freq: Four times a day (QID) | ORAL | Status: AC | PRN

## 2023-11-12 MED ORDER — TRAMADOL HCL 50 MG PO TABS: 50.0000 mg | ORAL_TABLET | Freq: Two times a day (BID) | ORAL | 0 refills | Status: AC | PRN

## 2023-11-12 MED ORDER — POLYETHYLENE GLYCOL 3350 17 G PO PACK: 17.0000 g | PACK | Freq: Every day | ORAL | 0 refills | Status: AC | PRN

## 2023-11-12 MED ORDER — METHOCARBAMOL 500 MG PO TABS: 500.0000 mg | ORAL_TABLET | Freq: Three times a day (TID) | ORAL | 0 refills | Status: DC | PRN

## 2023-11-12 NOTE — Progress Notes (Signed)
 Subjective: 2 Days Post-Op Procedure(s) (LRB): ARTHROPLASTY, HIP, TOTAL, ANTERIOR APPROACH (Right) Patient reports pain as mild.    Objective: Vital signs in last 24 hours: Temp:  [98.1 F (36.7 C)-98.4 F (36.9 C)] 98.1 F (36.7 C) (09/25 0434) Pulse Rate:  [79-94] 79 (09/25 0434) Resp:  [16] 16 (09/25 0434) BP: (133-144)/(55-62) 133/55 (09/25 0434) SpO2:  [96 %-98 %] 97 % (09/25 0434)  Intake/Output from previous day: 09/24 0701 - 09/25 0700 In: 720 [P.O.:720] Out: 550 [Urine:550] Intake/Output this shift: No intake/output data recorded.  Recent Labs    11/09/23 1319 11/10/23 0334 11/11/23 0334 11/12/23 0320  HGB 14.2 13.3 12.2 10.5*   Recent Labs    11/11/23 0334 11/12/23 0320  WBC 11.5* 8.5  RBC 3.94 3.35*  HCT 38.4 32.5*  PLT 152 126*   Recent Labs    11/11/23 0334 11/12/23 0320  NA 138 142  K 4.4 4.3  CL 105 109  CO2 21* 23  BUN 17 22  CREATININE 0.84 0.93  GLUCOSE 130* 105*  CALCIUM 9.4 8.7*   No results for input(s): LABPT, INR in the last 72 hours.  Neurologically intact Neurovascular intact Sensation intact distally Intact pulses distally Dorsiflexion/Plantar flexion intact Incision: scant drainage No cellulitis present Compartment soft   Assessment/Plan: 2 Days Post-Op Procedure(s) (LRB): ARTHROPLASTY, HIP, TOTAL, ANTERIOR APPROACH (Right) Up with therapy WBAT RLE ABLA- mild and stable ASA/SCDs for dvt ppx- rx on chart Norco for pain control- rx on chart D/c dispo per primary- sounds like home today with hhpt F/u with ortho two weeks post-op      Ronal LITTIE Grave 11/12/2023, 7:56 AM

## 2023-11-12 NOTE — Discharge Summary (Signed)
 [facility]  Physician Discharge Summary   Patient ID: Sally Le MRN: [id number] DOB/AGE: 1955/08/19 68 y.o.  Admit date: 11/09/2023 Discharge date: 11/12/2023    PCP: Alvia Corean CROME, FNP  DISCHARGE DIAGNOSES:  Principal Problem:   Closed subcapital fracture of neck of femur, right, initial encounter (HCC)   RECOMMENDATIONS FOR OUTPATIENT FOLLOW UP: Follow-up with orthopedics as instructed   Home Health: PT OT Equipment/Devices: None none  CODE STATUS: Full code  DISCHARGE CONDITION: fair  Diet recommendation: As before  INITIAL HISTORY: 68 y.o. female with without significant medical history comes into the hospital with right hip pain after having a fall at home.  This was a mechanical fall.  Imaging studies revealed right hip fracture.  Patient was hospitalized for further management.     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  Patient seems to be stable.  Labs are stable.  Mild drop in hemoglobin noted today.  Seen by PT.  Home health is ordered.  Okay for discharge home today.    PERTINENT LABS:  The results of significant diagnostics from this hospitalization (including imaging, microbiology, ancillary and laboratory) are listed below for reference.    Microbiology: Recent Results (from the past 240 hours)  Surgical PCR screen     Status: None   Collection Time: 11/09/23 10:04 PM   Specimen: Nasal Mucosa; Nasal Swab  Result Value Ref Range Status   MRSA, PCR NEGATIVE NEGATIVE Final   Staphylococcus aureus NEGATIVE NEGATIVE Final    Comment: (NOTE) The Xpert SA Assay (FDA approved for NASAL specimens in patients 26 years of age and older), is one component of a comprehensive surveillance program. It is not intended to diagnose infection nor to guide or monitor  treatment. Performed at [facility], [address] [place], [place] [address]      Labs:   Basic Metabolic Panel: Recent Labs  Lab 11/09/23 1406 11/09/23 2107 11/10/23 0334 11/11/23 0334 11/12/23 0320  NA 137  --  138 138 142  K 3.9  --  4.1 4.4 4.3  CL 104  --  105 105 109  CO2 22  --  21* 21* 23  GLUCOSE 119*  --  144* 130* 105*  BUN 16  --  16 17 22   CREATININE 0.84  --  0.86 0.84 0.93  CALCIUM 9.2  --  9.5 9.4 8.7*  MG  --  2.1  --  2.0 2.1  PHOS  --   --   --  3.3  --     CBC: Recent Labs  Lab 11/09/23 1319 11/10/23 0334 11/11/23 0334 11/12/23 0320  WBC 10.1 10.3 11.5* 8.5  NEUTROABS 8.9*  --   --   --   HGB 14.2 13.3 12.2 10.5*  HCT 43.2 41.9 38.4 32.5*  MCV 96.9 97.0 97.5 97.0  PLT 176 177 152 126*    IMAGING STUDIES DG Pelvis Portable Result Date: 11/10/2023 CLINICAL DATA:  Postop EXAM: PORTABLE PELVIS 1-2 VIEWS COMPARISON:  11/09/2023 FINDINGS: Changes of right hip replacement. Normal AP alignment. No hardware complicating feature. IMPRESSION: Right hip replacement.  No visible complicating feature. Electronically Signed   By: Franky Crease M.D.   On: 11/10/2023 20:38   DG HIP UNILAT WITH PELVIS 1V RIGHT Result Date: 11/10/2023 CLINICAL DATA:  Internal fixation EXAM: DG HIP (WITH  OR WITHOUT PELVIS) 1V RIGHT COMPARISON:  11/09/2023 FINDINGS: Multiple intraoperative spot images demonstrate changes of right hip replacement. No hardware complicating feature. Normal AP alignment. IMPRESSION: Right hip replacement without visible complicating feature. Electronically Signed   By: Franky Crease M.D.   On: 11/10/2023 20:38   DG C-Arm 1-60 Min-No Report Result Date: 11/10/2023 Fluoroscopy was utilized by the requesting physician.  No radiographic interpretation.   DG C-Arm 1-60 Min-No Report Result Date: 11/10/2023 Fluoroscopy was utilized by the requesting physician.  No radiographic interpretation.   DG Hip Unilat With Pelvis 2-3 Views  Right Result Date: 11/09/2023 CLINICAL DATA:  Right hip pain status post fall. EXAM: DG HIP (WITH OR WITHOUT PELVIS) 2-3V RIGHT COMPARISON:  None Available. FINDINGS: The bones appear adequately mineralized. There is an acute fracture of the right femoral neck which demonstrates up to 1.5 cm of superior displacement. No evidence of pelvic fracture, dislocation or underlying femoral head osteonecrosis. Mild degenerative changes of both hips and sacroiliac joints. IMPRESSION: Acute, moderately displaced fracture of the right femoral neck. Electronically Signed   By: Elsie Perone M.D.   On: 11/09/2023 14:05   DG Chest Port 1 View Result Date: 11/09/2023 CLINICAL DATA:  Witnessed fall.  Right hip pain. EXAM: PORTABLE CHEST 1 VIEW COMPARISON:  None Available. FINDINGS: 1324 hours. The heart size and mediastinal contours are normal. The lungs are clear. There is no pleural effusion or pneumothorax. No acute osseous findings are identified. Mild scoliosis. Telemetry leads overlie the chest. IMPRESSION: No evidence of acute chest injury. Mild scoliosis. Electronically Signed   By: Elsie Perone M.D.   On: 11/09/2023 14:04    DISCHARGE EXAMINATION: Vitals:   11/11/23 2224 11/12/23 0434 11/12/23 0900 11/12/23 1300  BP: (!) 144/62 (!) 133/55 (!) 124/59 (!) 105/50  Pulse: 94 79 85 84  Resp: 16 16 16 16   Temp: 98.1 F (36.7 C) 98.1 F (36.7 C) 98.9 F (37.2 C) 98.4 F (36.9 C)  TempSrc: Oral Oral Oral Oral  SpO2: 96% 97% 96%   Weight:      Height:       General appearance: Awake alert.  In no distress Resp: Clear to auscultation bilaterally.  Normal effort Cardio: S1-S2 is normal regular.  No S3-S4.  No rubs murmurs or bruit GI: Abdomen is soft.  Nontender nondistended.  Bowel sounds are present normal.  No masses organomegaly  DISPOSITION: Home  Discharge Instructions     Call MD for:  difficulty breathing, headache or visual disturbances   Complete by: As directed    Call MD for:  extreme  fatigue   Complete by: As directed    Call MD for:  persistant dizziness or light-headedness   Complete by: As directed    Call MD for:  persistant nausea and vomiting   Complete by: As directed    Call MD for:  redness, tenderness, or signs of infection (pain, swelling, redness, odor or green/yellow discharge around incision site)   Complete by: As directed    Call MD for:  severe uncontrolled pain   Complete by: As directed    Call MD for:  temperature >100.4   Complete by: As directed    Diet - low sodium heart healthy   Complete by: As directed    Discharge instructions   Complete by: As directed    Follow-up with orthopedics as instructed.  You were cared for by a hospitalist during your hospital stay. If you have any questions about your discharge  medications or the care you received while you were in the hospital after you are discharged, you can call the unit and asked to speak with the hospitalist on call if the hospitalist that took care of you is not available. Once you are discharged, your primary care physician will handle any further medical issues. Please note that NO REFILLS for any discharge medications will be authorized once you are discharged, as it is imperative that you return to your primary care physician (or establish a relationship with a primary care physician if you do not have one) for your aftercare needs so that they can reassess your need for medications and monitor your lab values. If you do not have a primary care physician, you can call [phone] for a physician referral.   Increase activity slowly   Complete by: As directed    No wound care   Complete by: As directed    Weight bearing as tolerated   Complete by: As directed          Allergies as of 11/12/2023       Reactions   Adhesive [tape] Rash, Other (See Comments)   The adhesive, not the tape, is the source of the problem If Coban wrap is used, an underlayment of gauze MUST be first placed.    Cat Dander Other (See Comments)   Sneezing   Sodium Tetradecyl Sulfate Itching, Other (See Comments)   Sodium Tetradecyl Sulfate is a sterile, nonpyrogenic solution for intravenous use that is used in a procedure called sclerotherapy to treat varicose and spider veins. Terrible itching   Amoxicillin Dermatitis, Rash   Erythromycin Nausea Only   Wound Dressing Adhesive Rash, Other (See Comments)   Band-aids = Sometimes, rashes occur        Medication List     TAKE these medications    acetaminophen  325 MG tablet Commonly known as: TYLENOL  Take 2 tablets (650 mg total) by mouth every 6 (six) hours as needed for mild pain (pain score 1-3) (or temp > 100.5).   AMBULATORY NON FORMULARY MEDICATION Take 30 mLs by mouth See admin instructions. Mangosteen juice - Drink 30 ml's by mouth once a day   aspirin  EC 325 MG tablet Take 1 tablet (325 mg total) by mouth daily for 28 days.   b complex vitamins capsule Take 1 capsule by mouth daily.   CALCIUM PO Take 1 tablet by mouth 2 (two) times daily with a meal.   CHERRY PO Take 500 mg by mouth daily.   docusate sodium  100 MG capsule Commonly known as: COLACE Take 1 capsule (100 mg total) by mouth 2 (two) times daily.   ELDERBERRY PO Take 3-4 g by mouth daily.   Fish Oil 1200 MG Caps Take 1,200 mg by mouth 3 (three) times a week.   Fluocinolone  Acetonide 0.01 % Oil Commonly known as: DermOtic  Place 3 drops in ear(s) once a week. What changed:  how to take this when to take this   HYDROcodone -acetaminophen  5-325 MG tablet Commonly known as: NORCO/VICODIN Take 1 tablet by mouth every 6 (six) hours as needed for moderate pain (pain score 4-6).   LUTEIN PO Take 1 tablet by mouth 2 (two) times a week.   MAGNESIUM  PO Take 30 mg by mouth daily with lunch.   methocarbamol  500 MG tablet Commonly known as: ROBAXIN  Take 1 tablet (500 mg total) by mouth every 8 (eight) hours as needed for muscle spasms.    nystatin-triamcinolone ointment Commonly known as: MYCOLOG  Apply 1 Application topically See admin instructions. Apply to both ears every Monday   polyethylene glycol 17 g packet Commonly known as: MIRALAX  / GLYCOLAX  Take 17 g by mouth daily as needed for mild constipation.   Rewetting Drops Soln Place 1 drop into both eyes 3 (three) times daily as needed (for dryness).   traMADol  50 MG tablet Commonly known as: ULTRAM  Take 1 tablet (50 mg total) by mouth 2 (two) times daily as needed.   UNABLE TO FIND Vein Formula + Bioflavonoids capsules - Take 1 capsule by mouth three times a week   Vitamin D3 50 MCG (2000 UT) Tabs Take 2,000 Units by mouth daily.   VITAMIN K2 PO Take 1 capsule by mouth daily.               Discharge Care Instructions  (From admission, onward)           Start     Ordered   11/10/23 0000  Weight bearing as tolerated        11/10/23 1558              Follow-up Information     Jerri Kay HERO, MD Follow up in 2 week(s).   Specialty: Orthopedic Surgery Why: For suture removal, For wound re-check Contact information: [address]  [place] [address] [phone]         [facility] Follow up.   Why: to provide home physical therapy visits Contact information: [address] [place] [address] [phone]                 TOTAL DISCHARGE TIME: 35 minutes  Marrio Scribner Verdene  [facility] Pager on [email or web address]  11/13/2023, 10:35 AM

## 2023-11-12 NOTE — Progress Notes (Signed)
 Physical Therapy Treatment Patient Details Name: Sally Le MRN: [id number] DOB: December 05, 1955 Today's Date: 11/12/2023   History of Present Illness 68 y.o. female with without significant medical history comes into the hospital with right hip pain after having a fall at home. Imaging studies revealed right hip fracture. Patient was hospitalized for further management and underwent total hip replacement of right hip on 9/23    PT Comments  Pt in recliner, agreeable to session. Pt to complete steps and stairs per home setup. Educated on hand placement for transfers, supervision assist from recliner through amb with RW to the stairs. Pt requires min A for ascending steps without HR using SPC and min A from husband. He was educated and demonstrates ability to safely assist with steps and stairs at home. HEP provided with guidelines. No further questions, comments or concerns at this time.     If plan is discharge home, recommend the following: A little help with walking and/or transfers;A little help with bathing/dressing/bathroom;Assistance with cooking/housework;Assist for transportation;Help with stairs or ramp for entrance   Can travel by private vehicle        Equipment Recommendations  None recommended by PT    Recommendations for Other Services       Precautions / Restrictions Precautions Precautions: Fall Recall of Precautions/Restrictions: Intact Restrictions Weight Bearing Restrictions Per Provider Order: Yes RLE Weight Bearing Per Provider Order: Weight bearing as tolerated     Mobility  Bed Mobility               General bed mobility comments: pt in recliner and returns to chair end of session    Transfers Overall transfer level: Needs assistance Equipment used: Rolling walker (2 wheels) Transfers: Sit to/from Stand Sit to Stand: Supervision           General transfer comment: Cues for safety, technique, hand placement.     Ambulation/Gait Ambulation/Gait assistance: Supervision Gait Distance (Feet): 180 Feet Assistive device: Rolling walker (2 wheels) Gait Pattern/deviations: Step-through pattern, Decreased stride length, Antalgic Gait velocity: dec     General Gait Details: Pt able to complete step through pattern with dec WB tolerance in RLE with SLS, inc UE assist on walker, maintains cadence and velocity througout   Stairs Stairs: Yes Stairs assistance: Min assist Stair Management: One rail Left, No rails, With cane Number of Stairs: 10 General stair comments: Pt completes 3 steps to mimic home entry with cane in LUE and HHA on RUE to negotiate steps in non reciprocal fashion. Progresses to complete 7 steps with good form using HR L ascending and non reciprocal and cane in RUE with CGA on gait belt. Husband trained in techniques for assist and guarding. Descends with similar assist and training completed, no LOB and demonstrates function to navigate between floors of the home   Wheelchair Mobility     Tilt Bed    Modified Rankin (Stroke Patients Only)       Balance Overall balance assessment: Needs assistance Sitting-balance support: No upper extremity supported, Feet supported Sitting balance-Leahy Scale: Good     Standing balance support: During functional activity, Reliant on assistive device for balance Standing balance-Leahy Scale: Fair                              Hotel manager: No apparent difficulties  Cognition Arousal: Alert Behavior During Therapy: WFL for tasks assessed/performed   PT - Cognitive impairments: No apparent impairments  Following commands: Intact      Cueing Cueing Techniques: Verbal cues  Exercises      General Comments        Pertinent Vitals/Pain Pain Assessment Pain Assessment: Faces Faces Pain Scale: Hurts a little bit Pain Location: R thigh Pain Descriptors /  Indicators: Burning, Aching, Discomfort Pain Intervention(s): Limited activity within patient's tolerance, Monitored during session    Home Living                          Prior Function            PT Goals (current goals can now be found in the care plan section) Acute Rehab PT Goals Patient Stated Goal: regain plof/independence PT Goal Formulation: With patient Time For Goal Achievement: 11/25/23 Potential to Achieve Goals: Good Progress towards PT goals: Progressing toward goals    Frequency    Min 6X/week      PT Plan      Co-evaluation              AM-PAC PT 6 Clicks Mobility   Outcome Measure  Help needed turning from your back to your side while in a flat bed without using bedrails?: A Little Help needed moving from lying on your back to sitting on the side of a flat bed without using bedrails?: A Little Help needed moving to and from a bed to a chair (including a wheelchair)?: A Little Help needed standing up from a chair using your arms (e.g., wheelchair or bedside chair)?: A Little Help needed to walk in hospital room?: A Little Help needed climbing 3-5 steps with a railing? : A Little 6 Click Score: 18    End of Session Equipment Utilized During Treatment: Gait belt Activity Tolerance: Patient tolerated treatment well Patient left: in chair;with call bell/phone within reach;with family/visitor present Nurse Communication: Mobility status PT Visit Diagnosis: Pain;History of falling (Z91.81);Other abnormalities of gait and mobility (R26.89) Pain - Right/Left: Right Pain - part of body: Hip;Leg     Time: 8890-8841 PT Time Calculation (min) (ACUTE ONLY): 49 min  Charges:    $Gait Training: 38-52 mins PT General Charges $$ ACUTE PT VISIT: 1 Visit                     Sally, PT Acute Rehabilitation Services Office: [phone] 11/12/2023    Sally Le 11/12/2023, 12:49 PM

## 2023-11-12 NOTE — Progress Notes (Signed)
 Occupational Therapy Treatment Patient Details Name: Sally Le MRN: [id number] DOB: November 21, 1955 Today's Date: 11/12/2023   History of present illness 68 y.o. female with without significant medical history comes into the hospital with right hip pain after having a fall at home. Imaging studies revealed right hip fracture. Patient was hospitalized for further management and underwent total hip replacement of right hip on 9/23   OT comments  Patient seen for skilled OT session with husband present for education and training below. Patient now using RW for accessing regular toilet and OT education on compensatory strategies for LE ADL's and safety. See below for current functional status. Patient requires continued Acute care hospital level OT services to progress safety and functional performance and allow for discharge.       If plan is discharge home, recommend the following:  A little help with walking and/or transfers;A little help with bathing/dressing/bathroom;Assistance with cooking/housework;Assist for transportation;Help with stairs or ramp for entrance         Precautions / Restrictions Precautions Precautions: Fall Recall of Precautions/Restrictions: Intact Restrictions Weight Bearing Restrictions Per Provider Order: Yes RLE Weight Bearing Per Provider Order: Weight bearing as tolerated       Mobility Bed Mobility Overal bed mobility:  (was in recliner and remained post session)                  Transfers Overall transfer level: Needs assistance Equipment used: Rolling walker (2 wheels) Transfers: Sit to/from Stand, Bed to chair/wheelchair/BSC Sit to Stand: Supervision     Step pivot transfers: Supervision           Balance Overall balance assessment: Needs assistance Sitting-balance support: No upper extremity supported, Feet supported Sitting balance-Leahy Scale: Good     Standing balance support: During functional activity, Reliant on assistive  device for balance Standing balance-Leahy Scale: Fair                             ADL either performed or assessed with clinical judgement   ADL Overall ADL's : Needs assistance/impaired Eating/Feeding: Independent   Grooming: Wash/dry hands;Wash/dry face;Oral care;Applying deodorant;Brushing hair;Set up;Sitting   Upper Body Bathing: Set up;Sitting   Lower Body Bathing: Minimal assistance;Sit to/from stand Lower Body Bathing Details (indicate cue type and reason): educated on LH sponge use Upper Body Dressing : Set up;Sitting   Lower Body Dressing: Minimal assistance;Sit to/from stand Lower Body Dressing Details (indicate cue type and reason): reacher use and compression stocking techniques trained with husband Toilet Transfer: Supervision/safety;Regular Toilet;Rolling walker (2 wheels)   Toileting- Clothing Manipulation and Hygiene: Set up;Sitting/lateral lean     Tub/Shower Transfer Details (indicate cue type and reason): discussed shower seat use and safety with gait belt for falls prevention Functional mobility during ADLs: Supervision/safety;Rolling walker (2 wheels) General ADL Comments: 5 P's for added education issued and training with higher level car access safety and compensatory strategies for LB self care    Extremity/Trunk Assessment Upper Extremity Assessment Upper Extremity Assessment: Overall WFL for tasks assessed   Lower Extremity Assessment Lower Extremity Assessment: Defer to PT evaluation                 Communication Communication Communication: No apparent difficulties   Cognition Arousal: Alert Behavior During Therapy: WFL for tasks assessed/performed Cognition: No apparent impairments  Following commands: Intact        Cueing   Cueing Techniques: Verbal cues        General Comments patient reports use of compression stockings daily at baseline, educated on pacing and breathing  integ    Pertinent Vitals/ Pain       Pain Assessment Pain Assessment: 0-10 Pain Score: 2  Pain Location: R thigh Pain Descriptors / Indicators: Burning, Aching, Discomfort Pain Intervention(s): Monitored during session, Limited activity within patient's tolerance, Ice applied   Frequency  Min 2X/week        Progress Toward Goals  OT Goals(current goals can now be found in the care plan section)  Progress towards OT goals: Goals met/education completed, patient discharged from OT  Acute Rehab OT Goals Patient Stated Goal: to leave shortly with husband OT Goal Formulation: With patient/family Time For Goal Achievement: 11/25/23 Potential to Achieve Goals: Good ADL Goals Pt Will Perform Grooming: with supervision;with set-up;standing;with caregiver independent in assisting Pt Will Perform Lower Body Bathing: with min assist;with contact guard assist;with caregiver independent in assisting Pt Will Perform Lower Body Dressing: with min assist;with contact guard assist;with caregiver independent in assisting Pt Will Transfer to Toilet: with supervision;with modified independence;ambulating Pt Will Perform Toileting - Clothing Manipulation and hygiene: with contact guard assist;with supervision;with modified independence;sitting/lateral leans;sit to/from stand;with caregiver independent in assisting  Plan         AM-PAC OT 6 Clicks Daily Activity     Outcome Measure   Help from another person eating meals?: None Help from another person taking care of personal grooming?: None Help from another person toileting, which includes using toliet, bedpan, or urinal?: A Little Help from another person bathing (including washing, rinsing, drying)?: A Little Help from another person to put on and taking off regular upper body clothing?: A Little Help from another person to put on and taking off regular lower body clothing?: A Little 6 Click Score: 20    End of Session Equipment  Utilized During Treatment: Gait belt;Rolling walker (2 wheels);Other (comment)  OT Visit Diagnosis: Unsteadiness on feet (R26.81);Other abnormalities of gait and mobility (R26.89);Muscle weakness (generalized) (M62.81)   Activity Tolerance Patient tolerated treatment well   Patient Left in chair;with call bell/phone within reach;with family/visitor present;with nursing/sitter in room   Nurse Communication Mobility status        Time: 8794-8774 OT Time Calculation (min): 20 min  Charges: OT General Charges $OT Visit: 1 Visit OT Treatments $Self Care/Home Management : 8-22 mins  Arlene Brickel OT/L Acute Rehabilitation Department  [phone]  11/12/2023, 1:14 PM

## 2023-11-16 ENCOUNTER — Telehealth: Payer: Self-pay | Admitting: Physician Assistant

## 2023-11-16 NOTE — Telephone Encounter (Signed)
 Patient called. She is having a COVID vaccine on 10/3. She would like to know if that's ok to have since she just had surgery would like a call.

## 2023-11-16 NOTE — Telephone Encounter (Signed)
 She wants to know about the flu vaccine as well? 6 weeks as well?

## 2023-11-16 NOTE — Telephone Encounter (Signed)
 yes

## 2023-11-16 NOTE — Telephone Encounter (Signed)
 I would wait 6 weeks to get the vaccine

## 2023-11-16 NOTE — Telephone Encounter (Signed)
 Called and notified patient.

## 2023-11-24 ENCOUNTER — Other Ambulatory Visit (INDEPENDENT_AMBULATORY_CARE_PROVIDER_SITE_OTHER): Payer: Self-pay

## 2023-11-24 ENCOUNTER — Ambulatory Visit (INDEPENDENT_AMBULATORY_CARE_PROVIDER_SITE_OTHER): Admitting: Physician Assistant

## 2023-11-24 DIAGNOSIS — Z96641 Presence of right artificial hip joint: Secondary | ICD-10-CM

## 2023-11-24 MED ORDER — CEPHALEXIN 500 MG PO CAPS: ORAL_CAPSULE | ORAL | 3 refills | Status: AC

## 2023-11-24 NOTE — Progress Notes (Signed)
 Post-Op Visit Note   Patient: Sally Le           Date of Birth: [birth year]           MRN: [id number] Visit Date: 11/24/2023 PCP: Alvia Corean CROME, FNP   Assessment & Plan:  Chief Complaint:  Chief Complaint  Patient presents with   Right Hip - Routine Post Op    Arthroplasty, Hip, Total, Anterior Approach - Right 11/10/23   Visit Diagnoses:  1. History of total hip replacement, right     Plan: Patient is a pleasant 68 year old female who comes in today 2 weeks status post right total hip replacement from a femoral neck fracture, date of surgery 11/10/2023.  She has been doing well.  She has been getting physical therapy at home and is ambulating with a walker.  She has been taking aspirin  325 daily for DVT prophylaxis.  She is not taking any narcotics for pain at this time.  Examination of her right hip reveals a well healing surgical incision with staples intact.  No evidence of infection or cellulitis.  Calves are soft nontender.  She is neurovascularly intact distally.  At this point, stitches were removed and Steri-Strips applied.  She will finish out her aspirin  prescription for DVT prophylaxis.  She would like to have referral for outpatient physical therapy set up.  Referral as well as given her a paper prescription as she would like to see Norleen Crawford.  She will follow-up with us  in 4 weeks for repeat evaluation and AP pelvis x-rays.  Follow-Up Instructions: Return in about 4 weeks (around 12/22/2023).   Orders:  Orders Placed This Encounter  Procedures   XR Pelvis 1-2 Views   Ambulatory referral to Physical Therapy   Meds ordered this encounter  Medications   cephALEXin (KEFLEX) 500 MG capsule    Sig: Take two pills one hour prior to dental work    Dispense:  4 capsule    Refill:  3    Imaging: XR Pelvis 1-2 Views Result Date: 11/24/2023 Well-seated prosthesis without complication   PMFS History: Patient Active Problem List   Diagnosis Date  Noted   Closed subcapital fracture of neck of femur, right, initial encounter (HCC) 11/09/2023   Medication management 06/01/2023   Vitamin B12 deficiency 06/01/2023   History of drug allergy 01/11/2018   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, pre-basal cell RIGHT cheek   Urticaria    Venous insufficiency    Vitamin D  deficiency     Family History  Problem Relation Age of Onset   Parkinson's disease Mother    Breast cancer Mother    Cancer Mother 39       Estrogen and progestin positive breast cancer   Cancer Father        oat cell lung CA with mets to brain   Pancreatic cancer Maternal Grandfather    Cancer Maternal Grandfather        pancreatic   Obesity Sister    Spondylolisthesis Sister    Asthma Sister    Cancer Maternal Aunt 7       lung cancer (non-smoker)   Heart disease Paternal Grandmother    Lung cancer Unknown     Past Surgical History:  Procedure Laterality Date   ADENOIDECTOMY     APPENDECTOMY     DERMOID CYST  EXCISION     TONSILLECTOMY     TONSILLECTOMY     TOTAL HIP ARTHROPLASTY Right 11/10/2023   Procedure: ARTHROPLASTY, HIP, TOTAL, ANTERIOR APPROACH;  Surgeon: Jerri Kay HERO, MD;  Location: [facility];  Service: Orthopedics;  Laterality: Right;   Social History   Occupational History   Occupation: Magazine features editor: Film/video editor For [employer]  Tobacco Use   Smoking status: Never   Smokeless tobacco: Never  Vaping Use   Vaping status: Never Used  Substance and Sexual Activity   Alcohol  use: Yes    Alcohol /week: 1.0 standard drink of alcohol     Types: 1 Glasses of wine per week   Drug use: No   Sexual activity: Yes    Partners: Male    Birth control/protection: Post-menopausal

## 2023-11-25 ENCOUNTER — Telehealth: Payer: Self-pay | Admitting: Physician Assistant

## 2023-11-25 ENCOUNTER — Other Ambulatory Visit: Payer: Self-pay

## 2023-11-25 DIAGNOSIS — Z96641 Presence of right artificial hip joint: Secondary | ICD-10-CM

## 2023-11-25 NOTE — Telephone Encounter (Signed)
 Arthroplasty, Hip, Total, Anterior Approach - Right 11/10/23   Who are you wanting patient to see for PT?

## 2023-11-25 NOTE — Telephone Encounter (Signed)
 Spoke with patient. Placed order for PT here at our office.

## 2023-11-25 NOTE — Telephone Encounter (Signed)
 Pt called requesting the best outpatient physical therapy person close to pt's home. Pt states Morna and her spoke of someone she new near [place] but that person is no longer at that facility. Please call pt about this matter at [phone] and send referral. Pt phone number is [phone].

## 2023-11-25 NOTE — Telephone Encounter (Signed)
 She wanted [employer] and [facility] was at [facility].  I am sure he is around town somewhere but I do not know where.  I gave her paper rx and sent in external referral for him.  She can take paper rx anywhere she wants or we can put in a different referral.  Does not matter to me where she goes

## 2023-12-01 ENCOUNTER — Ambulatory Visit (INDEPENDENT_AMBULATORY_CARE_PROVIDER_SITE_OTHER)

## 2023-12-01 ENCOUNTER — Encounter: Admitting: Orthopaedic Surgery

## 2023-12-02 ENCOUNTER — Encounter: Payer: Self-pay | Admitting: Rehabilitative and Restorative Service Providers"

## 2023-12-02 ENCOUNTER — Ambulatory Visit: Admitting: Rehabilitative and Restorative Service Providers"

## 2023-12-02 DIAGNOSIS — R262 Difficulty in walking, not elsewhere classified: Secondary | ICD-10-CM | POA: Diagnosis not present

## 2023-12-02 DIAGNOSIS — M25651 Stiffness of right hip, not elsewhere classified: Secondary | ICD-10-CM | POA: Diagnosis not present

## 2023-12-02 DIAGNOSIS — M25551 Pain in right hip: Secondary | ICD-10-CM | POA: Diagnosis not present

## 2023-12-02 DIAGNOSIS — M6281 Muscle weakness (generalized): Secondary | ICD-10-CM

## 2023-12-02 NOTE — Therapy (Signed)
 OUTPATIENT PHYSICAL THERAPY LOWER EXTREMITY EVALUATION   Patient Name: Sally Le MRN: [id number] DOB:18-Dec-1955, 68 y.o., female Today's Date: 12/02/2023  END OF SESSION:  PT End of Session - 12/02/23 1825     Visit Number 1    Number of Visits 16    Date for Recertification  01/27/24    Progress Note Due on Visit 16    PT Start Time 1148    PT Stop Time 1237    PT Time Calculation (min) 49 min    Activity Tolerance Patient tolerated treatment well;No increased pain;Patient limited by pain    Behavior During Therapy [facility] for tasks assessed/performed          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   Procedure: ARTHROPLASTY, HIP, TOTAL, ANTERIOR APPROACH;  Surgeon: Jerri Kay HERO, MD;  Location: [facility];  Service: Orthopedics;  Laterality: Right;   Patient Active Problem List   Diagnosis Date Noted   Closed subcapital fracture of neck of femur, right, initial encounter (HCC) 11/09/2023   Medication management 06/01/2023   Vitamin B12 deficiency 06/01/2023   History of drug allergy 01/11/2018   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   Allergic reaction 05/05/2017   Drug allergy 05/05/2017   Seborrheic dermatitis 02/26/2012    PCP: Corean LITTIE Ku, FNP  REFERRING PROVIDER: Ronal LITTIE Grave PA-C  REFERRING DIAG:  [phone] (ICD-10-CM) - History of total hip replacement, right    THERAPY DIAG:  Difficulty in walking, not elsewhere classified - Plan: PT plan of Sally Le cert/re-cert  Muscle weakness (generalized) - Plan: PT plan of Sally Le cert/re-cert  Pain in right hip - Plan: PT plan of  Sally Le cert/re-cert  Stiffness of right hip, not elsewhere classified - Plan: PT plan of Sally Le cert/re-cert  Rationale for Evaluation and Treatment: Rehabilitation  ONSET DATE: 11/09/2023 fall, THA 9/23,2025  SUBJECTIVE:   SUBJECTIVE STATEMENT: Sally Le fractured her right femur when she turned at home, but her foot stayed put.  She fell, landing on her right side.  Fracture of the proximal femur required THA the next day.  Everything is going well so far.  She may be getting some early plantar fasciitis.  PERTINENT HISTORY: Venous insufficiency  PAIN:  Are you having pain? Yes: NPRS scale: 0-3/10 right hip/groin; Rt trochanter; Rt foot over the past week as high as 7/10 Pain location: See above Pain description: Ache and sore hip, sharp foot Aggravating factors: More with standing, wrong shoe Relieving factors: Get of foot, ice  PRECAUTIONS: Anterior hip  RED FLAGS: None   WEIGHT BEARING RESTRICTIONS: No  FALLS:  Has patient fallen in last 6 months? Yes. Number of falls 1  LIVING ENVIRONMENT: Lives with: lives with their family and lives with their spouse Lives in: House/apartment Stairs: Needs help with stairs Has following equipment at home: Single point cane and Environmental consultant - 2 wheeled  OCCUPATION: Retired, works around the house  PLOF: Independent  PATIENT GOALS: Return to normal function; independent with chores; return to classical stretch  NEXT  MD VISIT: 12/22/2023  OBJECTIVE:  Note: Objective measures were completed at Evaluation unless otherwise noted.  DIAGNOSTIC FINDINGS: Well-seated prosthesis without complication  PATIENT SURVEYS:  PSFS: THE PATIENT SPECIFIC FUNCTIONAL SCALE  Place score of 0-10 (0 = unable to perform activity and 10 = able to perform activity at the same level as before injury or problem)  Activity Date: 12/02/2023    Normal exercise program 0    2.  Get down to the floor to put on shoes 0    3.  Changing sheets and doing laundry 3    4.       Total Score 1      Total Score = Sum of activity scores/number of activities  Minimally Detectable Change: 3 points (for single activity); 2 points (for average score)  [facility] (nd). The Patient Specific Functional Scale . Retrieved from [email or web address]   COGNITION: Overall cognitive status: Within functional limits for tasks assessed     SENSATION: Sally Le has no complaints of peripheral pain or paresthesias  EDEMA:  Noted but not objectively assessed  MUSCLE LENGTH: Hamstrings: Right 40 deg; Left 40 deg   LOWER EXTREMITY ROM:  Passive ROM Left/Right 12/02/2023   Hip flexion 100/85   Hip extension    Hip abduction    Hip adduction    Hip internal rotation 14/7   Hip external rotation 30/7   Knee flexion    Knee extension    Ankle dorsiflexion 1/-1   Ankle plantarflexion    Ankle inversion    Ankle eversion     (Blank rows = not tested)  LOWER EXTREMITY MMT:  MMT Left/Right 12/02/2023   Hip flexion 4+/4   Hip extension    Hip abduction 4-/3   Hip adduction    Hip internal rotation    Hip external rotation    Knee flexion    Knee extension 4-/4-   Ankle dorsiflexion    Ankle plantarflexion    Ankle inversion    Ankle eversion     (Blank rows = not tested)  GAIT: Distance walked: 50 feet Assistive device utilized: Environmental consultant - 2 wheeled Level of assistance: Modified independence Comments: Sally Le has been using a wheeled walker since surgery                                                                                                                            TREATMENT DATE:  12/02/2023 Yoga bridge 2 sets of 10 for 3 seconds Figure 4 stretch 4 x 20 seconds bilateral Upper slant board stretch with feet pointed slightly in 1 minute Lower slant board stretch with feet pointed slightly in 1 minute  02464: Discussed minimal precautions with anterior hip replacement; discussed  expected timeframe for transition to cane and no assistive device; answered questions about early rehabilitation, early plantar fasciitis symptoms and examination findings   PATIENT EDUCATION:  Education details: See above Person educated: Patient and Spouse Education method: Explanation, Demonstration,  Tactile cues, Verbal cues, and Handouts Education comprehension: verbalized understanding, returned demonstration, verbal cues required, tactile cues required, and needs further education  HOME EXERCISE PROGRAM: Access Code: [email or web address] URL: [email or web address] Date: 12/02/2023 Prepared by: Lamar Ivory  Exercises - Yoga Bridge  - 2 x daily - 7 x weekly - 2 sets - 10 reps - 3-5 seconds hold - Supine Figure 4 Piriformis Stretch  - 2 x daily - 7 x weekly - 1 sets - 5 reps - 20 seconds hold - Slant Board Gastrocnemius Stretch  - 3 x daily - 7 x weekly - 1 sets - 3 reps - 60 seconds hold - Slant Board Soleus Stretch  - 3 x daily - 7 x weekly - 1 sets - 3 reps - 60 seconds hold  ASSESSMENT:  CLINICAL IMPRESSION: Patient is a 68 y.o. female who was seen today for physical therapy evaluation and treatment for  Z96.641 (ICD-10-CM) - History of total hip replacement, right  .  Sally Le was a traumatic injury that required a hip replacement.  I anticipate she is in better shape than most people that undergo this procedure, although she may have more pain.  She also appears to be developing some early phase plantar fasciitis symptoms which we started to address today.  Her prognosis to meet the below listed goals is good with the recommended plan of Sally Le.  OBJECTIVE IMPAIRMENTS: Abnormal gait, decreased activity tolerance, decreased endurance, decreased knowledge of condition, difficulty walking, decreased ROM, decreased strength, increased edema, impaired perceived functional ability, and pain.   ACTIVITY LIMITATIONS: carrying, lifting, bending, sitting, standing, squatting, sleeping,  stairs, bed mobility, dressing, and locomotion level  PARTICIPATION LIMITATIONS: meal prep, cleaning, laundry, driving, shopping, and community activity  PERSONAL FACTORS: Venous insufficiency may also affect Sally Le functional outcome.   REHAB POTENTIAL: Good  CLINICAL DECISION MAKING: Stable/uncomplicated  EVALUATION COMPLEXITY: Low   GOALS: Goals reviewed with patient? Yes  SHORT TERM GOALS: Target date: 12/30/2023 Sally Le will be independent with her day 1 home exercise program Baseline: Started 12/02/2023 Goal status: INITIAL  2.  Improve right hip flexion to at least 90 degrees; improve right hip external rotation to at least 20 degrees and bilateral ankle dorsiflexion to at least 5 degrees Baseline: 85; 7 and 1/-1 respectively Goal status: INITIAL  3.  Sally Le will be able to walk with a single-point cane and the free from the wheeled walker for 100% of her ambulation Baseline: Wheeled walker 100% of the time Goal status: INITIAL   LONG TERM GOALS: Target date: 01/27/2024  Improve patient specific functional score to at least 6 Baseline: 1 Goal status: INITIAL  2.  Improve right hip and foot pain to no greater than 2/10 on the numeric pain rating scale Baseline: Foot as high as 7/10 Goal status: INITIAL  3.  Improve right hip flexion to at least 95 degrees; right hip external rotation to at least 25 degrees and bilateral ankle dorsiflexion active range of motion to at least 10 degrees Baseline: 85; 7 and 1/-1 respectively Goal status: INITIAL  4.  Improve right hip strength to at least 4+/5 MMT for hip flexion, quadriceps and hip abductors Baseline: See objective Goal status: INITIAL  5.  Sally Le will be able to walk without an assistive device or noticeable gait deviation Baseline: Wheeled walker her percent of the time Goal status: INITIAL  6.  Sally Le will be independent with her long-term maintenance home exercise program at discharge Baseline: Started  12/02/2023 Goal status: INITIAL  PLAN:  PT FREQUENCY: 1-2x/week  PT DURATION: 8 weeks  PLANNED INTERVENTIONS: 97750- Physical Performance Testing, 97110-Therapeutic exercises, 97530- Therapeutic activity, 97112- Neuromuscular re-education, 97535- Self Sally Le, [id number]- Gait training, Patient/Family education, Balance training, Stair training, and Cryotherapy  PLAN FOR NEXT SESSION: Post anterior total hip protocol along with activities to stretch heel cords and improve foot intrinsic strength to address early plantar fasciitis symptoms.  Heavy emphasis on hip abductors strengthening, balance and functional activities.   Myer LELON Ivory, PT, MPT 12/02/2023, 6:42 PM

## 2023-12-04 ENCOUNTER — Encounter: Payer: Self-pay | Admitting: Rehabilitative and Restorative Service Providers"

## 2023-12-04 ENCOUNTER — Ambulatory Visit (INDEPENDENT_AMBULATORY_CARE_PROVIDER_SITE_OTHER): Admitting: Rehabilitative and Restorative Service Providers"

## 2023-12-04 DIAGNOSIS — M25651 Stiffness of right hip, not elsewhere classified: Secondary | ICD-10-CM | POA: Diagnosis not present

## 2023-12-04 DIAGNOSIS — M6281 Muscle weakness (generalized): Secondary | ICD-10-CM

## 2023-12-04 DIAGNOSIS — M25551 Pain in right hip: Secondary | ICD-10-CM

## 2023-12-04 DIAGNOSIS — R262 Difficulty in walking, not elsewhere classified: Secondary | ICD-10-CM

## 2023-12-04 NOTE — Therapy (Signed)
 OUTPATIENT PHYSICAL THERAPY TREATMENT   Patient Name: Sally Le MRN: [id number] DOB:Mar 15, 1955, 68 y.o., female Today's Date: 12/04/2023  END OF SESSION:  PT End of Session - 12/04/23 0925     Visit Number 2    Number of Visits 16    Date for Recertification  01/27/24    Authorization Type [employer] $10 copay    PT Start Time 0934    PT Stop Time 1013    PT Time Calculation (min) 39 min    Activity Tolerance Patient tolerated treatment well    Behavior During Therapy [facility] for tasks assessed/performed           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   Procedure: ARTHROPLASTY, HIP, TOTAL, ANTERIOR APPROACH;  Surgeon: Jerri Kay HERO, MD;  Location: [facility];  Service: Orthopedics;  Laterality: Right;   Patient Active Problem List   Diagnosis Date Noted   Closed subcapital fracture of neck of femur, right, initial encounter (HCC) 11/09/2023   Medication management 06/01/2023   Vitamin B12 deficiency 06/01/2023   History of drug allergy 01/11/2018   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  Noted swelling still noted as well in leg.   Pt indicated no pain in hip at rest upon arrival today.   PERTINENT HISTORY: Venous insufficiency  PAIN:  NPRS scale: 0/10 in hip upon arrival.  Pain location: Rt hip  Pain description: Ache and sore hip, sharp foot Aggravating factors: More with standing, wrong shoe Relieving factors: Get of foot, ice  PRECAUTIONS: Anterior hip  RED FLAGS: None   WEIGHT BEARING RESTRICTIONS: No  FALLS:  Has patient fallen in last 6 months? Yes. Number of falls 1  LIVING ENVIRONMENT: Lives with: lives with their family and lives with their spouse Lives in: House/apartment Stairs: Needs help with stairs Has following equipment at home: Single point cane and Environmental consultant - 2 wheeled  OCCUPATION: Retired, works around the house  PLOF: Independent  PATIENT GOALS: Return to normal function; independent with chores; return to classical stretch  NEXT MD VISIT: 12/22/2023  OBJECTIVE:  Note: Objective measures were completed at Evaluation unless otherwise noted.  DIAGNOSTIC FINDINGS: Well-seated prosthesis without complication  PATIENT SURVEYS:  PSFS: THE PATIENT SPECIFIC FUNCTIONAL SCALE  Place score of 0-10 (0 = unable to perform activity and 10 = able to perform activity at the same level as before injury or problem)  Activity Date: 12/02/2023    Normal exercise  program 0    2.  Get down to the floor to put on shoes 0    3.  Changing sheets and doing laundry 3    4.      Total Score 1      Total Score = Sum of activity scores/number of activities  Minimally Detectable Change: 3 points (for single activity); 2 points (for average score)  [name] (nd). The Patient Specific Functional Scale . Retrieved from [email or web address]   COGNITION: 12/02/2023 Overall cognitive status:  Within functional limits for tasks assessed     SENSATION: 12/02/2023 Keara has no complaints of peripheral pain or paresthesias  EDEMA:  12/02/2023 Noted but not objectively assessed  MUSCLE LENGTH: 12/02/2023 Hamstrings: Right 40 deg; Left 40 deg   LOWER EXTREMITY ROM:  ROM Right 12/02/2023 PROM Left 12/02/2023 PROM  Hip flexion 100/85 100  Hip extension    Hip abduction    Hip adduction    Hip internal rotation 7 14  Hip external rotation 7 30  Knee flexion    Knee extension    Ankle dorsiflexion 1 1  Ankle plantarflexion    Ankle inversion    Ankle eversion     (Blank rows = not tested)  LOWER EXTREMITY MMT:  MMT Right 12/02/2023 Left 12/02/2023  Hip flexion 4 4+  Hip extension    Hip abduction 3 4-  Hip adduction    Hip internal rotation    Hip external rotation    Knee flexion    Knee extension 4- 4-  Ankle dorsiflexion    Ankle plantarflexion    Ankle inversion    Ankle eversion     (Blank rows = not tested)  GAIT: 12/02/2023 Distance walked: 50 feet Assistive device utilized: Environmental consultant - 2 wheeled Level of assistance: Modified independence Comments: Jakara has been using a wheeled walker since surgery                     TREATMENT        DATE: 12/04/2023 Therex: Nustep lvl 5 8 mins UE/LE for ROM.  Supine bridge 2-3 sec hold 2 x 10 Supine hooklying clam shell green band x 20 bilateral with contralateral isometric leg hold  Supine figure 4 push away 30 sec x 5 (cues to avoid pain increase > 3/10).  Discussed sliding Lt leg out to reduce initial stretch.   Neuro Re-ed(balance improvements, muscle activation, coordination) Supine bilateral quad set 5 sec hold x 15 for muscle activation Standing church pew anterior/posterior weight shift at table with SBA x 20 each direction Modified tandem stance 1 min x 1 bilateral with SBA and occasional Min A    Self Care Education on edema control for Rt leg to help reduced swelling response.  Encouraged  increased rest times and decreased duration of standing activity with use of elevation positioning and ankle pumps.  Discussed ankle pumps and LAQ.  Discussed options for bed elevation  TREATMENT        DATE: 12/02/2023 Yoga bridge 2 sets of 10 for 3 seconds Figure 4 stretch 4 x 20 seconds bilateral Upper slant board stretch with feet pointed slightly in 1 minute Lower slant board stretch with feet pointed slightly in 1 minute  97535: Discussed minimal precautions with anterior hip replacement; discussed expected timeframe for transition to cane and no assistive device; answered questions about early rehabilitation, early plantar fasciitis symptoms and examination findings   PATIENT EDUCATION:  12/02/2023 Education details: See above Person educated: Patient and Spouse Education method: Explanation, Demonstration, Tactile cues, Verbal cues, and Handouts Education comprehension: verbalized understanding, returned demonstration, verbal cues required, tactile cues required, and needs further education  HOME EXERCISE PROGRAM: Access Code: [email or web address] URL: [email or web address] Date: 12/02/2023 Prepared by: Lamar Ivory  Exercises - Yoga Bridge  - 2 x daily - 7 x weekly - 2 sets - 10 reps - 3-5 seconds hold - Supine Figure 4 Piriformis Stretch  - 2 x daily - 7 x weekly - 1 sets - 5 reps - 20 seconds hold - Slant Board Gastrocnemius Stretch  - 3 x daily - 7 x weekly - 1 sets - 3 reps - 60 seconds hold - Slant Board Soleus Stretch  - 3 x daily - 7 x weekly - 1 sets - 3 reps - 60 seconds hold  ASSESSMENT:  CLINICAL IMPRESSION: Good overall tolerance to intervention in clinic.  Reviewed HEP with good recall.  Emphasis on swelling control and progressive challenges to tightness will help presentation.  Continued skilled PT services indicated at this time.    OBJECTIVE IMPAIRMENTS: Abnormal gait, decreased activity tolerance, decreased endurance, decreased knowledge of condition, difficulty walking, decreased ROM, decreased strength, increased edema, impaired perceived functional ability, and pain.   ACTIVITY LIMITATIONS: carrying, lifting, bending, sitting, standing, squatting, sleeping, stairs, bed mobility, dressing, and locomotion level  PARTICIPATION LIMITATIONS: meal prep, cleaning, laundry, driving, shopping, and community activity  PERSONAL FACTORS: Venous insufficiency may also affect Jarika's functional outcome.   REHAB POTENTIAL: Good  CLINICAL DECISION MAKING: Stable/uncomplicated  EVALUATION COMPLEXITY: Low   GOALS: Goals reviewed with patient? Yes  SHORT TERM GOALS: Target date: 12/30/2023 Zekiah will be independent with her day 1 home exercise program  Goal status: on going 12/04/2023  2.  Improve right hip flexion to at least 90 degrees; improve right hip external rotation to at least 20 degrees and bilateral ankle dorsiflexion to at least 5 degrees  Goal status: on going 12/04/2023  3.  Berit will be able to walk with a single-point cane and the free from the wheeled walker for 100% of her ambulation  Goal status: on going 12/04/2023   LONG TERM GOALS: Target date: 01/27/2024  Improve patient specific functional score to at least 6 Baseline: 1 Goal status: INITIAL  2.  Improve right hip and foot pain to no greater than 2/10 on the numeric pain rating scale Baseline: Foot as high as 7/10 Goal status: INITIAL  3.  Improve right hip flexion to at least 95 degrees; right hip external rotation to at least 25 degrees and bilateral ankle dorsiflexion active range of motion to at least 10 degrees Baseline: 85; 7 and 1/-1 respectively Goal status: INITIAL  4.  Improve right hip strength to at least 4+/5 MMT for hip flexion, quadriceps and hip abductors Baseline: See objective Goal status: INITIAL  5.  Yatzary will  be able to walk without an assistive device or noticeable gait deviation Baseline: Wheeled walker  her percent of the time Goal status: INITIAL  6.  Care will be independent with her long-term maintenance home exercise program at discharge Baseline: Started 12/02/2023 Goal status: INITIAL   PLAN:  PT FREQUENCY: 1-2x/week  PT DURATION: 8 weeks  PLANNED INTERVENTIONS: 97750- Physical Performance Testing, 97110-Therapeutic exercises, 97530- Therapeutic activity, [id number]- Neuromuscular re-education, 97535- Self Care, [id number]- Gait training, Patient/Family education, Balance training, Stair training, and Cryotherapy  PLAN FOR NEXT SESSION: Static balance improvements, general LE strengthening.    Ozell Silvan, PT, DPT, OCS, ATC 12/04/23  10:12 AM

## 2023-12-05 NOTE — Therapy (Signed)
 OUTPATIENT PHYSICAL THERAPY TREATMENT   Patient Name: Sally Le MRN: [id number] DOB:January 09, 1956, 68 y.o., female Today's Date: 12/07/2023  END OF SESSION:  PT End of Session - 12/07/23 1015     Visit Number 3    Number of Visits 16    Date for Recertification  01/27/24    Authorization Type [employer] $10 copay    PT Start Time 0930    PT Stop Time 1008    PT Time Calculation (min) 38 min    Activity Tolerance Patient tolerated treatment well    Behavior During Therapy [facility] for tasks assessed/performed            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   Procedure: ARTHROPLASTY, HIP, TOTAL, ANTERIOR APPROACH;  Surgeon: Jerri Kay HERO, MD;  Location: [facility];  Service: Orthopedics;  Laterality: Right;   Patient Active Problem List   Diagnosis Date Noted   Closed subcapital fracture of neck of femur, right, initial encounter (HCC) 11/09/2023   Medication management 06/01/2023   Vitamin B12 deficiency 06/01/2023   History of drug allergy 01/11/2018   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   Allergic reaction 05/05/2017   Drug allergy 05/05/2017   Seborrheic dermatitis 02/26/2012    PCP: Corean LITTIE Ku, FNP  REFERRING PROVIDER: Ronal LITTIE Grave PA-C  REFERRING DIAG:  [phone] (ICD-10-CM) - History of total hip replacement, right    THERAPY DIAG:  Difficulty in walking, not elsewhere classified  Muscle weakness (generalized)  Pain in right hip  Stiffness of right hip, not elsewhere classified  Rationale for Evaluation and Treatment: Rehabilitation  ONSET DATE: 11/09/2023  fall, THA 9/23,2025  SUBJECTIVE:   SUBJECTIVE STATEMENT: Pt indicated continued  R foot pain and occasional twinges at lateral hip. PERTINENT HISTORY: Venous insufficiency  PAIN:  NPRS scale: 0/10 in hip upon arrival.  Pain location: Rt hip  Pain description: Ache and sore hip, sharp foot Aggravating factors: More with standing, wrong shoe Relieving factors: Get of foot, ice  PRECAUTIONS: Anterior hip  RED FLAGS: None   WEIGHT BEARING RESTRICTIONS: No  FALLS:  Has patient fallen in last 6 months? Yes. Number of falls 1  LIVING ENVIRONMENT: Lives with: lives with their family and lives with their spouse Lives in: House/apartment Stairs: Needs help with stairs Has following equipment at home: Single point cane and Environmental consultant - 2 wheeled  OCCUPATION: Retired, works around the house  PLOF: Independent  PATIENT GOALS: Return to normal function; independent with chores; return to classical stretch  NEXT MD VISIT: 12/22/2023  OBJECTIVE:  Note: Objective measures were completed at Evaluation unless otherwise noted.  DIAGNOSTIC FINDINGS: Well-seated prosthesis without complication  PATIENT SURVEYS:  PSFS: THE PATIENT SPECIFIC FUNCTIONAL SCALE  Place score of 0-10 (0 = unable to perform activity and 10 = able to perform activity at the same level as before injury or problem)  Activity Date: 12/02/2023    Normal exercise program 0    2.  Get down to the floor to put on shoes  0    3.  Changing sheets and doing laundry 3    4.      Total Score 1      Total Score = Sum of activity scores/number of activities  Minimally Detectable Change: 3 points (for single activity); 2 points (for average score)  [facility] (nd). The Patient Specific Functional Scale . Retrieved from [email or web address]   COGNITION: 12/02/2023 Overall cognitive status: Within functional limits for tasks  assessed     SENSATION: 12/02/2023 Sally Le has no complaints of peripheral pain or paresthesias  EDEMA:  12/02/2023 Noted but not objectively assessed  MUSCLE LENGTH: 12/02/2023 Hamstrings: Right 40 deg; Left 40 deg   LOWER EXTREMITY ROM:  ROM Right 12/02/2023 PROM Left 12/02/2023 PROM  Hip flexion 100/85 100  Hip extension    Hip abduction    Hip adduction    Hip internal rotation 7 14  Hip external rotation 7 30  Knee flexion    Knee extension    Ankle dorsiflexion 1 1  Ankle plantarflexion    Ankle inversion    Ankle eversion     (Blank rows = not tested)  LOWER EXTREMITY MMT:  MMT Right 12/02/2023 Left 12/02/2023  Hip flexion 4 4+  Hip extension    Hip abduction 3 4-  Hip adduction    Hip internal rotation    Hip external rotation    Knee flexion    Knee extension 4- 4-  Ankle dorsiflexion    Ankle plantarflexion    Ankle inversion    Ankle eversion     (Blank rows = not tested)  GAIT: 12/02/2023 Distance walked: 50 feet Assistive device utilized: Environmental consultant - 2 wheeled Level of assistance: Modified independence Comments: Sally Le has been using a wheeled walker since surgery                   TREATMENT        DATE: 12/07/23 R THA Therex: Nustep lvl 5 8.5 mins UE/LE for ROM.  Supine bridge 2-3 sec hold 2 x 10 Supine hooklying clam shell green band 2x10 SAQ 2x10 Incline board stretch 3x25 sec Neuro Re-ed(balance improvements, muscle activation, coordination) Standing anterior/posterior weight shift at table with SBA x 20 each direction Modified tandem stance 2x 25 sec each foot in front with SBA  Supine marching to activate hip flexors 2x15  Access Code: [email or web address] URL: [email or web address] Date: 12/07/2023 Prepared by: Burnard Meth  Exercises - Supine March  - 2 x daily - 7 x weekly - 2 sets - 10 reps   TREATMENT        DATE: 12/04/2023 Therex: Nustep lvl 5 8 mins UE/LE for ROM.  Supine bridge 2-3 sec hold 2 x 10 Supine  hooklying clam shell green band x 20 bilateral with contralateral isometric leg hold  Supine figure 4 push away 30 sec x 5 (cues to avoid pain increase > 3/10).  Discussed sliding Lt leg out to reduce initial stretch.   Neuro Re-ed(balance improvements, muscle activation, coordination) Supine bilateral quad set 5 sec hold x 15 for muscle activation Standing church pew anterior/posterior weight shift at table with SBA x 20 each direction Modified tandem stance 1 min x 1 bilateral with SBA and occasional Min A    Self Sally Le Education on edema control for Rt leg to help reduced swelling response.  Encouraged increased rest times and decreased duration of standing activity with use of elevation positioning and ankle pumps.  Discussed ankle pumps and LAQ.  Discussed options for bed elevation                                                                                                                              TREATMENT        DATE: 12/02/2023 Yoga bridge 2 sets of 10 for 3 seconds Figure 4 stretch 4 x 20 seconds bilateral Upper slant board stretch with feet pointed slightly in 1 minute Lower slant board stretch with feet pointed slightly in 1 minute  [id number]: Discussed minimal precautions with anterior hip replacement; discussed expected timeframe for transition to cane and no assistive device; answered questions about early rehabilitation, early plantar fasciitis symptoms and examination findings   PATIENT EDUCATION:  12/02/2023 Education details: See above Person educated: Patient and Spouse Education method: Explanation, Demonstration, Tactile cues, Verbal cues, and Handouts Education comprehension: verbalized understanding, returned demonstration, verbal cues required, tactile cues required, and needs further education  HOME EXERCISE PROGRAM: Access Code: [email or web address] URL: [email or web address] Date: 12/02/2023 Prepared by: Lamar Ivory  Exercises - Yoga Bridge  - 2  x daily - 7 x weekly - 2 sets - 10 reps - 3-5 seconds hold - Supine Figure 4 Piriformis Stretch  - 2 x daily - 7 x weekly - 1 sets - 5 reps - 20 seconds hold - Slant Board Gastrocnemius Stretch  - 3 x daily - 7 x weekly - 1 sets - 3 reps - 60 seconds hold - Slant Board Soleus Stretch  - 3 x daily - 7 x weekly - 1 sets - 3 reps - 60 seconds hold  ASSESSMENT:  CLINICAL IMPRESSION: Pt needed VC for new exercises.  Demonstrated understanding. OBJECTIVE IMPAIRMENTS: Abnormal gait, decreased activity tolerance, decreased endurance, decreased knowledge of condition, difficulty walking, decreased ROM, decreased strength, increased edema, impaired perceived functional ability, and pain.   ACTIVITY LIMITATIONS: carrying, lifting, bending, sitting, standing, squatting, sleeping, stairs, bed mobility, dressing, and locomotion level  PARTICIPATION LIMITATIONS: meal prep, cleaning, laundry, driving, shopping, and community activity  PERSONAL FACTORS: Venous insufficiency may also affect Sally Le's functional outcome.   REHAB POTENTIAL: Good  CLINICAL DECISION MAKING: Stable/uncomplicated  EVALUATION COMPLEXITY: Low   GOALS: Goals reviewed with patient? Yes  SHORT TERM GOALS: Target date: 12/30/2023 Sally Le will be independent with her day 1 home exercise program  Goal status: on going 12/04/2023  2.  Improve right hip flexion to at least 90 degrees; improve right hip external rotation to at least 20 degrees and bilateral ankle dorsiflexion to at least 5 degrees  Goal status: on going 12/04/2023  3.  Sally Le will be able to walk with a single-point cane and the free from the wheeled walker for 100% of her ambulation  Goal status: on going 12/04/2023   LONG TERM GOALS: Target date: 01/27/2024  Improve patient specific functional score to at least 6 Baseline: 1 Goal status: INITIAL  2.  Improve right hip and foot pain to no greater than 2/10 on the  numeric pain rating scale Baseline: Foot as  high as 7/10 Goal status: INITIAL  3.  Improve right hip flexion to at least 95 degrees; right hip external rotation to at least 25 degrees and bilateral ankle dorsiflexion active range of motion to at least 10 degrees Baseline: 85; 7 and 1/-1 respectively Goal status: INITIAL  4.  Improve right hip strength to at least 4+/5 MMT for hip flexion, quadriceps and hip abductors Baseline: See objective Goal status: INITIAL  5.  Sally Le will be able to walk without an assistive device or noticeable gait deviation Baseline: Wheeled walker her percent of the time Goal status: INITIAL  6.  Sally Le will be independent with her long-term maintenance home exercise program at discharge Baseline: Started 12/02/2023 Goal status: INITIAL   PLAN:  PT FREQUENCY: 1-2x/week  PT DURATION: 8 weeks  PLANNED INTERVENTIONS: 97750- Physical Performance Testing, 97110-Therapeutic exercises, 97530- Therapeutic activity, [id number]- Neuromuscular re-education, 97535- Self Sally Le, [id number]- Gait training, Patient/Family education, Balance training, Stair training, and Cryotherapy  PLAN FOR NEXT SESSION: Static balance improvements, general LE strengthening.   Burnard Meth, PT 12/07/23  10:17 AM

## 2023-12-07 ENCOUNTER — Ambulatory Visit

## 2023-12-07 DIAGNOSIS — R262 Difficulty in walking, not elsewhere classified: Secondary | ICD-10-CM

## 2023-12-07 DIAGNOSIS — M6281 Muscle weakness (generalized): Secondary | ICD-10-CM

## 2023-12-07 DIAGNOSIS — M25651 Stiffness of right hip, not elsewhere classified: Secondary | ICD-10-CM | POA: Diagnosis not present

## 2023-12-07 DIAGNOSIS — M25551 Pain in right hip: Secondary | ICD-10-CM | POA: Diagnosis not present

## 2023-12-09 ENCOUNTER — Ambulatory Visit: Admitting: Physical Therapy

## 2023-12-09 ENCOUNTER — Encounter: Payer: Self-pay | Admitting: Physical Therapy

## 2023-12-09 DIAGNOSIS — M25651 Stiffness of right hip, not elsewhere classified: Secondary | ICD-10-CM | POA: Diagnosis not present

## 2023-12-09 DIAGNOSIS — M25551 Pain in right hip: Secondary | ICD-10-CM | POA: Diagnosis not present

## 2023-12-09 DIAGNOSIS — M6281 Muscle weakness (generalized): Secondary | ICD-10-CM

## 2023-12-09 DIAGNOSIS — R262 Difficulty in walking, not elsewhere classified: Secondary | ICD-10-CM

## 2023-12-09 NOTE — Therapy (Signed)
 OUTPATIENT PHYSICAL THERAPY TREATMENT   Patient Name: Sally Le MRN: [id number] DOB:23-Apr-1955, 68 y.o., female Today's Date: 12/09/2023  END OF SESSION:  PT End of Session - 12/09/23 1518     Visit Number 4    Number of Visits 16    Date for Recertification  01/27/24    Authorization Type [employer] $10 copay    Progress Note Due on Visit 10    PT Start Time 1518    PT Stop Time 1603    PT Time Calculation (min) 45 min    Equipment Utilized During Treatment Gait belt    Activity Tolerance Patient tolerated treatment well    Behavior During Therapy WFL for tasks assessed/performed             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   Procedure: ARTHROPLASTY, HIP, TOTAL, ANTERIOR APPROACH;  Surgeon: Jerri Kay HERO, MD;  Location: [facility];  Service: Orthopedics;  Laterality: Right;   Patient Active Problem List   Diagnosis Date Noted   Closed subcapital fracture of neck of femur, right, initial encounter (HCC) 11/09/2023   Medication management 06/01/2023   Vitamin B12 deficiency 06/01/2023   History of drug allergy 01/11/2018   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 The hip is feeling better.    PERTINENT HISTORY: Venous insufficiency  PAIN:  NPRS scale:  0/10 in hip upon arrival.  Pain location: Rt hip  Pain description: Ache and sore hip, sharp foot Aggravating factors: More with standing, wrong shoe Relieving factors: Get of foot, ice  PRECAUTIONS: Anterior hip  RED FLAGS: None   WEIGHT BEARING RESTRICTIONS: No  FALLS:  Has patient fallen in last 6 months? Yes. Number of falls 1  LIVING ENVIRONMENT: Lives with: lives with their family and lives with their spouse Lives in: House/apartment Stairs: Needs help with stairs Has following equipment at home: Single point cane and Environmental consultant - 2 wheeled  OCCUPATION: Retired, works around the house  PLOF: Independent  PATIENT GOALS: Return to normal function; independent with chores; return to classical stretch  NEXT MD VISIT: 12/22/2023  OBJECTIVE:  Note: Objective measures were completed at Evaluation unless otherwise noted.  DIAGNOSTIC FINDINGS: Well-seated prosthesis without complication  PATIENT SURVEYS:  PSFS: THE PATIENT SPECIFIC FUNCTIONAL SCALE  Place score of 0-10 (0 = unable to perform activity and 10 = able to perform activity at the same level as before injury or problem)  Activity Date: 12/02/2023  Normal exercise program 0    2.  Get down to the floor to put on shoes 0    3.  Changing sheets and doing laundry 3    4.      Total Score 1      Total Score = Sum of activity scores/number of activities  Minimally Detectable Change: 3 points (for single activity); 2 points (for average score)  [name] (nd). The Patient Specific Functional Scale . Retrieved from [email or web address]   COGNITION: 12/02/2023 Overall  cognitive status: Within functional limits for tasks assessed     SENSATION: 12/02/2023 Kailie has no complaints of peripheral pain or paresthesias  EDEMA:  12/02/2023 Noted but not objectively assessed  MUSCLE LENGTH: 12/02/2023 Hamstrings: Right 40 deg; Left 40 deg   LOWER EXTREMITY ROM:  ROM Right 12/02/2023 PROM Left 12/02/2023 PROM  Hip flexion 100/85 100  Hip extension    Hip abduction    Hip adduction    Hip internal rotation 7 14  Hip external rotation 7 30  Knee flexion    Knee extension    Ankle dorsiflexion 1 1  Ankle plantarflexion    Ankle inversion    Ankle eversion     (Blank rows = not tested)  LOWER EXTREMITY MMT:  MMT Right 12/02/2023 Left 12/02/2023  Hip flexion 4 4+  Hip extension    Hip abduction 3 4-  Hip adduction    Hip internal rotation    Hip external rotation    Knee flexion    Knee extension 4- 4-  Ankle dorsiflexion    Ankle plantarflexion    Ankle inversion    Ankle eversion     (Blank rows = not tested)  GAIT: 12/02/2023 Distance walked: 50 feet Assistive device utilized: Environmental consultant - 2 wheeled Level of assistance: Modified independence Comments: Amaris has been using a wheeled walker since surgery                   TREATMENT        DATE: 12/09/23 R THA Therapeutic exercises: SciFit bike seat 10 level 1 BLEs & BUEs 6 min, then BLEs only for 2 min. RLE Supine bridge 10 reps, RLE Supine bridge with hip abd/add 10 reps Supine heel slide with ball under RLE strap assist max flexion to leg press/ knee ext 10 reps 2 sets `  Neuromuscular Re-education: Modified tandem RLE forward & backwad 30 sec each Rocker board (square 2 pivot points) ant/midline/post & right/midline/left 1 min ea with BUE support //bars and mirror for visual feedback for posture Standing crossways on foam beam to facilitate hip strategy feet hip width apart mirror for visual feedback & PT tactile cues / supervision: 5 reps ea head motion right/midline/left,  up/midline/down and diagonals. Stepping alternating LEs and un-weighting contralateral LE with light UE support: 10 reps ea forward, lateral & backwards.   Gait Training: Pt amb 25' without device with CGA.   PT recommended walking single aisle in grocery store pushing cart (her husband has power cart) then switch for 2-3 aisles to rest.  Goal is 3 single aisles initially.    TREATMENT        DATE: 12/07/23 R THA Therex: Nustep lvl 5 8.5 mins UE/LE for ROM.  Supine bridge 2-3 sec hold 2 x 10 Supine hooklying clam shell green band 2x10 SAQ 2x10 Incline board stretch 3x25 sec Neuro Re-ed(balance improvements, muscle activation, coordination) Standing anterior/posterior weight shift at table with SBA x 20 each direction  Modified tandem stance 2x 25 sec each foot in front with SBA  Supine marching to activate hip flexors 2x15  Access Code: [email or web address] URL: [email or web address] Date: 12/07/2023 Prepared by: Burnard Meth  Exercises - Supine March  - 2 x daily - 7 x weekly - 2 sets - 10 reps   TREATMENT        DATE: 12/04/2023 Therex: Nustep lvl 5 8 mins UE/LE for ROM.  Supine bridge 2-3 sec hold 2 x 10 Supine hooklying clam shell green band x 20 bilateral with contralateral isometric leg hold  Supine figure 4 push away 30 sec x 5 (cues to avoid pain increase > 3/10).  Discussed sliding Lt leg out to reduce initial stretch.   Neuro Re-ed(balance improvements, muscle activation, coordination) Supine bilateral quad set 5 sec hold x 15 for muscle activation Standing church pew anterior/posterior weight shift at table with SBA x 20 each direction Modified tandem stance 1 min x 1 bilateral with SBA and occasional Min A    Self Care Education on edema control for Rt leg to help reduced swelling response.  Encouraged increased rest times and decreased duration of standing activity with use of elevation positioning and ankle pumps.  Discussed ankle pumps and LAQ.  Discussed  options for bed elevation                                                                                                                              TREATMENT        DATE: 12/02/2023 Yoga bridge 2 sets of 10 for 3 seconds Figure 4 stretch 4 x 20 seconds bilateral Upper slant board stretch with feet pointed slightly in 1 minute Lower slant board stretch with feet pointed slightly in 1 minute  02464: Discussed minimal precautions with anterior hip replacement; discussed expected timeframe for transition to cane and no assistive device; answered questions about early rehabilitation, early plantar fasciitis symptoms and examination findings   PATIENT EDUCATION:  12/02/2023 Education details: See above Person educated: Patient and Spouse Education method: Explanation, Demonstration, Tactile cues, Verbal cues, and Handouts Education comprehension: verbalized understanding, returned demonstration, verbal cues required, tactile cues required, and needs further education  HOME EXERCISE PROGRAM: Access Code: [email or web address] URL: [email or web address] Date: 12/02/2023 Prepared by: Lamar Ivory  Exercises - Yoga Bridge  - 2 x daily - 7 x weekly - 2 sets - 10 reps - 3-5 seconds hold - Supine Figure 4 Piriformis Stretch  - 2 x daily - 7 x weekly - 1 sets - 5 reps - 20 seconds hold - Slant Board Gastrocnemius Stretch  - 3 x daily - 7 x weekly - 1 sets - 3 reps - 60 seconds hold - Slant Board Soleus Stretch  - 3 x daily - 7 x weekly - 1 sets - 3 reps - 60 seconds hold  ASSESSMENT:  CLINICAL IMPRESSION: PT progressed standing balance / functional activities today which she tolerated well.  She is anxious  initially but improved with instruction & repetition gaining confidence.  Pt has potential to ambulate with no device in home & cane for basic community in next couple of weeks it appears.  Patient continues to benefit from skilled PT.    OBJECTIVE IMPAIRMENTS: Abnormal gait, decreased  activity tolerance, decreased endurance, decreased knowledge of condition, difficulty walking, decreased ROM, decreased strength, increased edema, impaired perceived functional ability, and pain.   ACTIVITY LIMITATIONS: carrying, lifting, bending, sitting, standing, squatting, sleeping, stairs, bed mobility, dressing, and locomotion level  PARTICIPATION LIMITATIONS: meal prep, cleaning, laundry, driving, shopping, and community activity  PERSONAL FACTORS: Venous insufficiency may also affect Marybel's functional outcome.   REHAB POTENTIAL: Good  CLINICAL DECISION MAKING: Stable/uncomplicated  EVALUATION COMPLEXITY: Low   GOALS: Goals reviewed with patient? Yes  SHORT TERM GOALS: Target date: 12/30/2023 Ailyn will be independent with her day 1 home exercise program  Goal status: Ongoing   12/09/2023  2.  Improve right hip flexion to at least 90 degrees; improve right hip external rotation to at least 20 degrees and bilateral ankle dorsiflexion to at least 5 degrees  Goal status: Ongoing   12/09/2023  3.  Joya will be able to walk with a single-point cane and the free from the wheeled walker for 100% of her ambulation  Goal status: Ongoing   12/09/2023   LONG TERM GOALS: Target date: 01/27/2024  Improve patient specific functional score to at least 6 Baseline: 1 Goal status: Ongoing   12/09/2023  2.  Improve right hip and foot pain to no greater than 2/10 on the numeric pain rating scale Baseline: Foot as high as 7/10 Goal status: Ongoing   12/09/2023  3.  Improve right hip flexion to at least 95 degrees; right hip external rotation to at least 25 degrees and bilateral ankle dorsiflexion active range of motion to at least 10 degrees Baseline: 85; 7 and 1/-1 respectively Goal status: Ongoing   12/09/2023  4.  Improve right hip strength to at least 4+/5 MMT for hip flexion, quadriceps and hip abductors Baseline: See objective Goal status: Ongoing   12/09/2023  5.  Emmalise  will be able to walk without an assistive device or noticeable gait deviation Baseline: Wheeled walker her percent of the time Goal status: Ongoing   12/09/2023  6.  Care will be independent with her long-term maintenance home exercise program at discharge Baseline: Started 12/02/2023 Goal status: Ongoing   12/09/2023   PLAN:  PT FREQUENCY: 1-2x/week  PT DURATION: 8 weeks  PLANNED INTERVENTIONS: 97750- Physical Performance Testing, 97110-Therapeutic exercises, 97530- Therapeutic activity, 97112- Neuromuscular re-education, 97535- Self Care, [id number]- Gait training, Patient/Family education, Balance training, Stair training, and Cryotherapy  PLAN FOR NEXT SESSION: work on gait household level without device and basic community with cane.    Balance activities, general LE strengthening.    Grayce Spatz, PT, DPT 12/09/2023, 4:22 PM

## 2023-12-11 NOTE — Therapy (Signed)
 OUTPATIENT PHYSICAL THERAPY TREATMENT   Patient Name: Sally Le MRN: [id number] DOB:01-02-1956, 68 y.o., female Today's Date: 12/14/2023  END OF SESSION:  PT End of Session - 12/14/23 0936     Visit Number 5    Number of Visits 16    Date for Recertification  01/27/24    Authorization Type [employer] $10 copay    Progress Note Due on Visit 10    PT Start Time 0937    PT Stop Time 1015    PT Time Calculation (min) 38 min    Equipment Utilized During Treatment Gait belt    Activity Tolerance Patient tolerated treatment well    Behavior During Therapy WFL for tasks assessed/performed              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   Procedure: ARTHROPLASTY, HIP, TOTAL, ANTERIOR APPROACH;  Surgeon: Sally Kay HERO, MD;  Location: [facility];  Service: Orthopedics;  Laterality: Right;   Patient Active Problem List   Diagnosis Date Noted   Closed subcapital fracture of neck of femur, right, initial encounter (HCC) 11/09/2023   Medication management 06/01/2023   Vitamin B12 deficiency 06/01/2023   History of drug allergy 01/11/2018   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   Allergic reaction 05/05/2017   Drug allergy 05/05/2017   Seborrheic dermatitis 02/26/2012    PCP: Sally LITTIE Ku, FNP  REFERRING PROVIDER: Ronal LITTIE Grave PA-C  REFERRING DIAG:  [phone] (ICD-10-CM) - History of total hip replacement, right    THERAPY DIAG:  Pain in right hip  Difficulty in walking, not elsewhere classified  Muscle weakness (generalized)  Stiffness of right hip, not elsewhere  classified  Rationale for Evaluation and Treatment: Rehabilitation  ONSET DATE: 11/09/2023 fall, THA 11/10/2023  SUBJECTIVE:   SUBJECTIVE STATEMENT: Patient endorsing intermittent soreness in Rt hip, though improved plantar fasciitis symptoms.  PERTINENT HISTORY: Venous insufficiency  PAIN:  NPRS scale:  not rated this session  Pain location: Rt hip  Pain description: Ache and sore hip, sharp foot Aggravating factors: More with standing, wrong shoe Relieving factors: Get of foot, ice  PRECAUTIONS: Anterior hip  RED FLAGS: None   WEIGHT BEARING RESTRICTIONS: No  FALLS:  Has patient fallen in last 6 months? Yes. Number of falls 1  LIVING ENVIRONMENT: Lives with: lives with their family and lives with their spouse Lives in: House/apartment Stairs: Needs help with stairs Has following equipment at home: Single point cane and Environmental Consultant - 2 wheeled  OCCUPATION: Retired, works around the house  PLOF: Independent  PATIENT GOALS: Return to normal function; independent with chores; return to classical stretch  NEXT MD VISIT: 12/22/2023  OBJECTIVE:  Note: Objective measures were completed at Evaluation unless otherwise noted.  DIAGNOSTIC FINDINGS: Well-seated prosthesis without complication  PATIENT SURVEYS:  PSFS: THE PATIENT SPECIFIC FUNCTIONAL SCALE  Place score of 0-10 (0 = unable to perform activity and 10 = able to perform activity at the same level as before injury or problem)  Activity Date: 12/02/2023  Normal exercise program 0    2.  Get down to the floor to put on shoes 0    3.  Changing sheets and doing laundry 3    4.      Total Score 1      Total Score = Sum of activity scores/number of activities  Minimally Detectable Change: 3 points (for single activity); 2 points (for average score)  [facility] (nd). The Patient Specific Functional Scale . Retrieved from [email or web address]    COGNITION: 12/02/2023 Overall cognitive status: Within functional limits for tasks assessed     SENSATION: 12/02/2023 Sally Le has no complaints of peripheral pain or paresthesias  EDEMA:  12/02/2023 Noted but not objectively assessed  MUSCLE LENGTH: 12/02/2023 Hamstrings: Right 40 deg; Left 40 deg   LOWER EXTREMITY ROM:  ROM Right 12/02/2023 PROM Left 12/02/2023 PROM  Hip flexion 100/85 100  Hip extension    Hip abduction    Hip adduction    Hip internal rotation 7 14  Hip external rotation 7 30  Knee flexion    Knee extension    Ankle dorsiflexion 1 1  Ankle plantarflexion    Ankle inversion    Ankle eversion     (Blank rows = not tested)  LOWER EXTREMITY MMT:  MMT Right 12/02/2023 Left 12/02/2023  Hip flexion 4 4+  Hip extension    Hip abduction 3 4-  Hip adduction    Hip internal rotation    Hip external rotation    Knee flexion    Knee extension 4- 4-  Ankle dorsiflexion    Ankle plantarflexion    Ankle inversion    Ankle eversion     (Blank rows = not tested)  GAIT: 12/02/2023 Distance walked: 50 feet Assistive device utilized: Environmental Consultant - 2 wheeled Level of assistance: Modified independence Comments: Sally Le has been using a wheeled walker since surgery                   TREATMENT        DATE: 12/14/23 R THA TherEx: UBE with bilat UE and LE for 4 minutes then bilat LE only for 4 minutes  Slant board gastroc stretch 3x30s  Sit<>stands from mat table 2x10 with no UE use  Supine abduction with green TB 2x12  Supine SLR with focus on quad activation ; discontinued secondary to difficulty and discomfort  Seated marches 1x10, 1x10 with 2# weights  Seated LAQ with 2# weights 2x10 each side   Self-Care:  PT discussed typical recovery timeline and pain levels, return to prior exercises, and what to expect with transitioning through ADs  TREATMENT        DATE: 12/09/23 R THA Therapeutic exercises: SciFit bike seat 10 level 1 BLEs & BUEs 6 min,  then BLEs only for 2 min. RLE Supine bridge 10 reps, RLE Supine bridge with hip abd/add 10 reps Supine heel slide with ball under RLE strap assist max flexion to leg press/ knee ext 10 reps 2 sets `  Neuromuscular Re-education: Modified tandem RLE forward & backwad 30 sec each Rocker board (square 2 pivot points) ant/midline/post & right/midline/left 1 min ea with BUE support //bars and mirror for visual feedback for posture Standing crossways on foam beam to facilitate hip strategy feet hip width apart mirror for visual feedback & PT tactile cues / supervision: 5 reps ea head motion right/midline/left, up/midline/down and diagonals. Stepping alternating LEs and un-weighting contralateral LE with light UE support: 10 reps ea forward, lateral &  backwards.   Gait Training: Pt amb 25' without device with CGA.   PT recommended walking single aisle in grocery store pushing cart (her husband has power cart) then switch for 2-3 aisles to rest.  Goal is 3 single aisles initially.    TREATMENT        DATE: 12/07/23 R THA Therex: Nustep lvl 5 8.5 mins UE/LE for ROM.  Supine bridge 2-3 sec hold 2 x 10 Supine hooklying clam shell green band 2x10 SAQ 2x10 Incline board stretch 3x25 sec Neuro Re-ed(balance improvements, muscle activation, coordination) Standing anterior/posterior weight shift at table with SBA x 20 each direction Modified tandem stance 2x 25 sec each foot in front with SBA  Supine marching to activate hip flexors 2x15  Access Code: [email or web address] URL: [email or web address] Date: 12/07/2023 Prepared by: Burnard Meth  Exercises - Supine March  - 2 x daily - 7 x weekly - 2 sets - 10 reps   TREATMENT        DATE: 12/04/2023 Therex: Nustep lvl 5 8 mins UE/LE for ROM.  Supine bridge 2-3 sec hold 2 x 10 Supine hooklying clam shell green band x 20 bilateral with contralateral isometric leg hold  Supine figure 4 push away 30 sec x 5 (cues to avoid pain increase > 3/10).   Discussed sliding Lt leg out to reduce initial stretch.   Neuro Re-ed(balance improvements, muscle activation, coordination) Supine bilateral quad set 5 sec hold x 15 for muscle activation Standing church pew anterior/posterior weight shift at table with SBA x 20 each direction Modified tandem stance 1 min x 1 bilateral with SBA and occasional Min A    Self Care Education on edema control for Rt leg to help reduced swelling response.  Encouraged increased rest times and decreased duration of standing activity with use of elevation positioning and ankle pumps.  Discussed ankle pumps and LAQ.  Discussed options for bed elevation                                                                                                                              TREATMENT        DATE: 12/02/2023 Yoga bridge 2 sets of 10 for 3 seconds Figure 4 stretch 4 x 20 seconds bilateral Upper slant board stretch with feet pointed slightly in 1 minute Lower slant board stretch with feet pointed slightly in 1 minute  [id number]: Discussed minimal precautions with anterior hip replacement; discussed expected timeframe for transition to cane and no assistive device; answered questions about early rehabilitation, early plantar fasciitis symptoms and examination findings   PATIENT EDUCATION:  12/02/2023 Education details: See above Person educated: Patient and Spouse Education method: Explanation, Demonstration, Tactile cues, Verbal cues, and Handouts Education comprehension: verbalized understanding, returned demonstration, verbal cues required, tactile cues required, and needs further education  HOME EXERCISE PROGRAM: Access Code: [email or web address] URL: [email or web address] Date: 12/02/2023 Prepared by: Lamar Ivory  Exercises - Yoga Bridge  - 2 x daily -  7 x weekly - 2 sets - 10 reps - 3-5 seconds hold - Supine Figure 4 Piriformis Stretch  - 2 x daily - 7 x weekly - 1 sets - 5 reps - 20 seconds hold -  Slant Board Gastrocnemius Stretch  - 3 x daily - 7 x weekly - 1 sets - 3 reps - 60 seconds hold - Slant Board Soleus Stretch  - 3 x daily - 7 x weekly - 1 sets - 3 reps - 60 seconds hold  ASSESSMENT:  CLINICAL IMPRESSION: Patient arrived to session endorsing improved symptoms with plantar fasciitis. Patient tolerated all activities this date, though unable to perform supine SLR. Patient will continue to benefit from skilled PT.   OBJECTIVE IMPAIRMENTS: Abnormal gait, decreased activity tolerance, decreased endurance, decreased knowledge of condition, difficulty walking, decreased ROM, decreased strength, increased edema, impaired perceived functional ability, and pain.   ACTIVITY LIMITATIONS: carrying, lifting, bending, sitting, standing, squatting, sleeping, stairs, bed mobility, dressing, and locomotion level  PARTICIPATION LIMITATIONS: meal prep, cleaning, laundry, driving, shopping, and community activity  PERSONAL FACTORS: Venous insufficiency may also affect Solange's functional outcome.   REHAB POTENTIAL: Good  CLINICAL DECISION MAKING: Stable/uncomplicated  EVALUATION COMPLEXITY: Low   GOALS: Goals reviewed with patient? Yes  SHORT TERM GOALS: Target date: 12/30/2023 Tahliyah will be independent with her day 1 home exercise program  Goal status: Ongoing   12/09/2023  2.  Improve right hip flexion to at least 90 degrees; improve right hip external rotation to at least 20 degrees and bilateral ankle dorsiflexion to at least 5 degrees  Goal status: Ongoing   12/09/2023  3.  Aidah will be able to walk with a single-point cane and the free from the wheeled walker for 100% of her ambulation  Goal status: Ongoing   12/09/2023   LONG TERM GOALS: Target date: 01/27/2024  Improve patient specific functional score to at least 6 Baseline: 1 Goal status: Ongoing   12/09/2023  2.  Improve right hip and foot pain to no greater than 2/10 on the numeric pain rating scale Baseline:  Foot as high as 7/10 Goal status: Ongoing   12/09/2023  3.  Improve right hip flexion to at least 95 degrees; right hip external rotation to at least 25 degrees and bilateral ankle dorsiflexion active range of motion to at least 10 degrees Baseline: 85; 7 and 1/-1 respectively Goal status: Ongoing   12/09/2023  4.  Improve right hip strength to at least 4+/5 MMT for hip flexion, quadriceps and hip abductors Baseline: See objective Goal status: Ongoing   12/09/2023  5.  Elsey will be able to walk without an assistive device or noticeable gait deviation Baseline: Wheeled walker her percent of the time Goal status: Ongoing   12/09/2023  6.  Care will be independent with her long-term maintenance home exercise program at discharge Baseline: Started 12/02/2023 Goal status: Ongoing   12/09/2023   PLAN:  PT FREQUENCY: 1-2x/week  PT DURATION: 8 weeks  PLANNED INTERVENTIONS: 97750- Physical Performance Testing, 97110-Therapeutic exercises, 97530- Therapeutic activity, 97112- Neuromuscular re-education, 97535- Self Care, [id number]- Gait training, Patient/Family education, Balance training, Stair training, and Cryotherapy  PLAN FOR NEXT SESSION: work on gait household level without device and basic community with cane.    Balance activities, general LE strengthening.     Susannah Daring, PT, DPT 12/14/23 10:39 AM

## 2023-12-14 ENCOUNTER — Ambulatory Visit

## 2023-12-14 DIAGNOSIS — R262 Difficulty in walking, not elsewhere classified: Secondary | ICD-10-CM

## 2023-12-14 DIAGNOSIS — M25651 Stiffness of right hip, not elsewhere classified: Secondary | ICD-10-CM | POA: Diagnosis not present

## 2023-12-14 DIAGNOSIS — M25551 Pain in right hip: Secondary | ICD-10-CM | POA: Diagnosis not present

## 2023-12-14 DIAGNOSIS — M6281 Muscle weakness (generalized): Secondary | ICD-10-CM

## 2023-12-15 NOTE — Therapy (Signed)
 OUTPATIENT PHYSICAL THERAPY TREATMENT   Patient Name: Sally Le MRN: [id number] DOB:Sep 18, 1955, 68 y.o., female Today's Date: 12/16/2023  END OF SESSION:  PT End of Session - 12/16/23 1015     Visit Number 6    Number of Visits 16    Date for Recertification  01/27/24    Authorization Type [employer] $10 copay    Progress Note Due on Visit 10    PT Start Time 1015    PT Stop Time 1102    PT Time Calculation (min) 47 min    Equipment Utilized During Treatment Gait belt    Activity Tolerance Patient tolerated treatment well    Behavior During Therapy WFL for tasks assessed/performed               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   Procedure: ARTHROPLASTY, HIP, TOTAL, ANTERIOR APPROACH;  Surgeon: Jerri Kay HERO, MD;  Location: [facility];  Service: Orthopedics;  Laterality: Right;   Patient Active Problem List   Diagnosis Date Noted   Closed subcapital fracture of neck of femur, right, initial encounter (HCC) 11/09/2023   Medication management 06/01/2023   Vitamin B12 deficiency 06/01/2023   History of drug allergy 01/11/2018   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   Allergic reaction 05/05/2017   Drug allergy 05/05/2017   Seborrheic dermatitis 02/26/2012    PCP: Corean LITTIE Ku, FNP  REFERRING PROVIDER: Ronal LITTIE Grave PA-C  REFERRING DIAG:  [phone] (ICD-10-CM) - History of total hip replacement, right    THERAPY DIAG:  Pain in right hip  Difficulty in walking, not elsewhere classified  Muscle weakness (generalized)  Stiffness of right hip, not elsewhere  classified  Rationale for Evaluation and Treatment: Rehabilitation  ONSET DATE: 11/09/2023 fall, THA 11/10/2023  SUBJECTIVE:   SUBJECTIVE STATEMENT: Patient reporting no changes in symptoms.   PERTINENT HISTORY: Venous insufficiency  PAIN:  NPRS scale:  not rated this session  Pain location: Rt hip  Pain description: Ache and sore hip, sharp foot Aggravating factors: More with standing, wrong shoe Relieving factors: Get of foot, ice  PRECAUTIONS: Anterior hip  RED FLAGS: None   WEIGHT BEARING RESTRICTIONS: No  FALLS:  Has patient fallen in last 6 months? Yes. Number of falls 1  LIVING ENVIRONMENT: Lives with: lives with their family and lives with their spouse Lives in: House/apartment Stairs: Needs help with stairs Has following equipment at home: Single point cane and Environmental Consultant - 2 wheeled  OCCUPATION: Retired, works around the house  PLOF: Independent  PATIENT GOALS: Return to normal function; independent with chores; return to classical stretch  NEXT MD VISIT: 12/22/2023  OBJECTIVE:  Note: Objective measures were completed at Evaluation unless otherwise noted.  DIAGNOSTIC FINDINGS: Well-seated prosthesis without complication  PATIENT SURVEYS:  PSFS: THE PATIENT SPECIFIC FUNCTIONAL SCALE  Place score of 0-10 (0 = unable to perform activity and 10 = able to perform activity at the same level as before injury or problem)  Activity Date: 12/02/2023    Normal exercise  program 0    2.  Get down to the floor to put on shoes 0    3.  Changing sheets and doing laundry 3    4.      Total Score 1      Total Score = Sum of activity scores/number of activities  Minimally Detectable Change: 3 points (for single activity); 2 points (for average score)  [name] (nd). The Patient Specific Functional Scale . Retrieved from [email or web address]   COGNITION: 12/02/2023 Overall cognitive status: Within  functional limits for tasks assessed     SENSATION: 12/02/2023 Haden has no complaints of peripheral pain or paresthesias  EDEMA:  12/02/2023 Noted but not objectively assessed  MUSCLE LENGTH: 12/02/2023 Hamstrings: Right 40 deg; Left 40 deg   LOWER EXTREMITY ROM:  ROM Right 12/02/2023 PROM Left 12/02/2023 PROM  Hip flexion 100/85 100  Hip extension    Hip abduction    Hip adduction    Hip internal rotation 7 14  Hip external rotation 7 30  Knee flexion    Knee extension    Ankle dorsiflexion 1 1  Ankle plantarflexion    Ankle inversion    Ankle eversion     (Blank rows = not tested)  LOWER EXTREMITY MMT:  MMT Right 12/02/2023 Left 12/02/2023  Hip flexion 4 4+  Hip extension    Hip abduction 3 4-  Hip adduction    Hip internal rotation    Hip external rotation    Knee flexion    Knee extension 4- 4-  Ankle dorsiflexion    Ankle plantarflexion    Ankle inversion    Ankle eversion     (Blank rows = not tested)  GAIT: 12/02/2023 Distance walked: 50 feet Assistive device utilized: Environmental Consultant - 2 wheeled Level of assistance: Modified independence Comments: Arvella has been using a wheeled walker since surgery                   TREATMENT        DATE: 12/16/23 Gait Training:  PT educated and demonstrated on on 3-point vs modified 2-point technique Patient ambulated with SPC with stand alone tip and SBA for 99.5' using modified 2-point technique ; no overt gait deviations or large differences between Rt and Lt step lengths and stance times  Patient ambulated without AD and CGA for 99.5' with downward gaze and fearful gait, though no overt trunk lean or antalgic mechanics  Discussed with patient where to find the stand alone tip, how to apply, appropriate height for cane for optimal mechanics, and transition to mostly full time use of cane with use of RW only in times when feeling unstable/sore/tired/long distances  TherEx: Nustep level 4 for 8 minutes with bilat  UE and LE  Sit<>stands from mat table 2x12 with no UE use   Step ups with Rt LE using 4 step 2x8 with UE use throughout  PT discussed protocol/precautions following anterior hip replacements and when to follow up with MD about questions    TREATMENT        DATE: 12/14/23 R THA TherEx: UBE with bilat UE and LE for 4 minutes then bilat LE only for 4 minutes  Slant board gastroc stretch 3x30s  Sit<>stands from mat table 2x10 with no UE use  Supine abduction with green TB 2x12  Supine SLR with focus on quad activation ; discontinued secondary to difficulty and discomfort  Seated marches 1x10, 1x10 with 2# weights  Seated LAQ with 2#  weights 2x10 each side   Self-Care:  PT discussed typical recovery timeline and pain levels, return to prior exercises, and what to expect with transitioning through ADs  TREATMENT        DATE: 12/09/23 R THA Therapeutic exercises: SciFit bike seat 10 level 1 BLEs & BUEs 6 min, then BLEs only for 2 min. RLE Supine bridge 10 reps, RLE Supine bridge with hip abd/add 10 reps Supine heel slide with ball under RLE strap assist max flexion to leg press/ knee ext 10 reps 2 sets `  Neuromuscular Re-education: Modified tandem RLE forward & backwad 30 sec each Rocker board (square 2 pivot points) ant/midline/post & right/midline/left 1 min ea with BUE support //bars and mirror for visual feedback for posture Standing crossways on foam beam to facilitate hip strategy feet hip width apart mirror for visual feedback & PT tactile cues / supervision: 5 reps ea head motion right/midline/left, up/midline/down and diagonals. Stepping alternating LEs and un-weighting contralateral LE with light UE support: 10 reps ea forward, lateral & backwards.   Gait Training: Pt amb 25' without device with CGA.   PT recommended walking single aisle in grocery store pushing cart (her husband has power cart) then switch for 2-3 aisles to rest.  Goal is 3 single aisles initially.     TREATMENT        DATE: 12/07/23 R THA Therex: Nustep lvl 5 8.5 mins UE/LE for ROM.  Supine bridge 2-3 sec hold 2 x 10 Supine hooklying clam shell green band 2x10 SAQ 2x10 Incline board stretch 3x25 sec Neuro Re-ed(balance improvements, muscle activation, coordination) Standing anterior/posterior weight shift at table with SBA x 20 each direction Modified tandem stance 2x 25 sec each foot in front with SBA  Supine marching to activate hip flexors 2x15  Access Code: [email or web address] URL: [email or web address] Date: 12/07/2023 Prepared by: Burnard Meth  Exercises - Supine March  - 2 x daily - 7 x weekly - 2 sets - 10 reps   PATIENT EDUCATION:  12/02/2023 Education details: See above Person educated: Patient and Spouse Education method: Explanation, Demonstration, Tactile cues, Verbal cues, and Handouts Education comprehension: verbalized understanding, returned demonstration, verbal cues required, tactile cues required, and needs further education  HOME EXERCISE PROGRAM: Access Code: [email or web address] URL: [email or web address] Date: 12/02/2023 Prepared by: Lamar Ivory  Exercises - Yoga Bridge  - 2 x daily - 7 x weekly - 2 sets - 10 reps - 3-5 seconds hold - Supine Figure 4 Piriformis Stretch  - 2 x daily - 7 x weekly - 1 sets - 5 reps - 20 seconds hold - Slant Board Gastrocnemius Stretch  - 3 x daily - 7 x weekly - 1 sets - 3 reps - 60 seconds hold - Slant Board Soleus Stretch  - 3 x daily - 7 x weekly - 1 sets - 3 reps - 60 seconds hold  ASSESSMENT:  CLINICAL IMPRESSION: Patient arrived to session noting no change in symptoms with only slight increases in discomfort with increases in hip flexion. Patient ambulated with and without AD this date with no required increase in physical assist from PT and no overt changes in gait mechanics throughout. Patient will continue to benefit from skilled PT.   OBJECTIVE IMPAIRMENTS: Abnormal gait, decreased activity  tolerance, decreased endurance, decreased knowledge of condition, difficulty walking, decreased ROM, decreased strength, increased edema, impaired perceived functional ability, and pain.   ACTIVITY LIMITATIONS: carrying, lifting, bending, sitting, standing, squatting, sleeping, stairs, bed mobility, dressing, and locomotion level  PARTICIPATION  LIMITATIONS: meal prep, cleaning, laundry, driving, shopping, and community activity  PERSONAL FACTORS: Venous insufficiency may also affect Viva's functional outcome.   REHAB POTENTIAL: Good  CLINICAL DECISION MAKING: Stable/uncomplicated  EVALUATION COMPLEXITY: Low   GOALS: Goals reviewed with patient? Yes  SHORT TERM GOALS: Target date: 12/30/2023 Jaunice will be independent with her day 1 home exercise program  Goal status: Ongoing   12/09/2023  2.  Improve right hip flexion to at least 90 degrees; improve right hip external rotation to at least 20 degrees and bilateral ankle dorsiflexion to at least 5 degrees  Goal status: Ongoing   12/09/2023  3.  Kemiah will be able to walk with a single-point cane and the free from the wheeled walker for 100% of her ambulation  Goal status: Ongoing   12/09/2023   LONG TERM GOALS: Target date: 01/27/2024  Improve patient specific functional score to at least 6 Baseline: 1 Goal status: Ongoing   12/09/2023  2.  Improve right hip and foot pain to no greater than 2/10 on the numeric pain rating scale Baseline: Foot as high as 7/10 Goal status: Ongoing   12/09/2023  3.  Improve right hip flexion to at least 95 degrees; right hip external rotation to at least 25 degrees and bilateral ankle dorsiflexion active range of motion to at least 10 degrees Baseline: 85; 7 and 1/-1 respectively Goal status: Ongoing   12/09/2023  4.  Improve right hip strength to at least 4+/5 MMT for hip flexion, quadriceps and hip abductors Baseline: See objective Goal status: Ongoing   12/09/2023  5.  Makaiah will be  able to walk without an assistive device or noticeable gait deviation Baseline: Wheeled walker her percent of the time Goal status: Ongoing   12/09/2023  6.  Care will be independent with her long-term maintenance home exercise program at discharge Baseline: Started 12/02/2023 Goal status: Ongoing   12/09/2023   PLAN:  PT FREQUENCY: 1-2x/week  PT DURATION: 8 weeks  PLANNED INTERVENTIONS: 97750- Physical Performance Testing, 97110-Therapeutic exercises, 97530- Therapeutic activity, 97112- Neuromuscular re-education, 97535- Self Care, [id number]- Gait training, Patient/Family education, Balance training, Stair training, and Cryotherapy  PLAN FOR NEXT SESSION:  continued gait training, stairs,   Balance activities, general LE strengthening.     Susannah Daring, PT, DPT 12/16/23 4:02 PM

## 2023-12-16 ENCOUNTER — Ambulatory Visit

## 2023-12-16 DIAGNOSIS — M25551 Pain in right hip: Secondary | ICD-10-CM

## 2023-12-16 DIAGNOSIS — M6281 Muscle weakness (generalized): Secondary | ICD-10-CM | POA: Diagnosis not present

## 2023-12-16 DIAGNOSIS — R262 Difficulty in walking, not elsewhere classified: Secondary | ICD-10-CM | POA: Diagnosis not present

## 2023-12-16 DIAGNOSIS — M25651 Stiffness of right hip, not elsewhere classified: Secondary | ICD-10-CM | POA: Diagnosis not present

## 2023-12-18 NOTE — Therapy (Signed)
 OUTPATIENT PHYSICAL THERAPY TREATMENT   Patient Name: Sally Le MRN: [id number] DOB:01-26-1956, 68 y.o., female Today's Date: 12/21/2023  END OF SESSION:  PT End of Session - 12/21/23 1136     Visit Number 7    Number of Visits 16    Date for Recertification  01/27/24    Authorization Type [employer] $10 copay    Progress Note Due on Visit 10    PT Start Time 1146    PT Stop Time 1226    PT Time Calculation (min) 40 min    Equipment Utilized During Treatment Gait belt    Activity Tolerance Patient tolerated treatment well    Behavior During Therapy WFL for tasks assessed/performed            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   Procedure: ARTHROPLASTY, HIP, TOTAL, ANTERIOR APPROACH;  Surgeon: Sally Kay HERO, MD;  Location: [facility];  Service: Orthopedics;  Laterality: Right;   Patient Active Problem List   Diagnosis Date Noted   Closed subcapital fracture of neck of femur, right, initial encounter (HCC) 11/09/2023   Medication management 06/01/2023   Vitamin B12 deficiency 06/01/2023   History of drug allergy 01/11/2018   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   Allergic reaction 05/05/2017   Drug allergy 05/05/2017   Seborrheic dermatitis 02/26/2012    PCP: Sally LITTIE Ku, FNP  REFERRING PROVIDER: Ronal LITTIE Grave PA-C  REFERRING DIAG:  [phone] (ICD-10-CM) - History of total hip replacement, right    THERAPY DIAG:  Difficulty in walking, not elsewhere classified  Muscle weakness (generalized)  Pain in right hip  Stiffness of right hip, not elsewhere  classified  Rationale for Evaluation and Treatment: Rehabilitation  ONSET DATE: 11/09/2023 fall, THA 11/10/2023  SUBJECTIVE:   SUBJECTIVE STATEMENT: Patient reporting potential kidney stone over the weekend, but has completely recovered.    PERTINENT HISTORY: Venous insufficiency  PAIN:  NPRS scale:  tenderness, but no pain rated   Pain location: Rt hip  Pain description: Ache and sore hip, sharp foot Aggravating factors: More with standing, wrong shoe Relieving factors: Get of foot, ice  PRECAUTIONS: Anterior hip  RED FLAGS: None   WEIGHT BEARING RESTRICTIONS: No  FALLS:  Has patient fallen in last 6 months? Yes. Number of falls 1  LIVING ENVIRONMENT: Lives with: lives with their family and lives with their spouse Lives in: House/apartment Stairs: Needs help with stairs Has following equipment at home: Single point cane and Environmental Consultant - 2 wheeled  OCCUPATION: Retired, works around the house  PLOF: Independent  PATIENT GOALS: Return to normal function; independent with chores; return to classical stretch  NEXT MD VISIT: 12/22/2023  OBJECTIVE:  Note: Objective measures were completed at Evaluation unless otherwise noted.  DIAGNOSTIC FINDINGS: Well-seated prosthesis without complication  PATIENT SURVEYS:  PSFS: THE PATIENT SPECIFIC FUNCTIONAL SCALE  Place score of 0-10 (0 = unable to perform activity and 10 = able to perform activity at the same level as before injury or problem)  Activity Date:  12/02/2023    Normal exercise program 0    2.  Get down to the floor to put on shoes 0    3.  Changing sheets and doing laundry 3    4.      Total Score 1      Total Score = Sum of activity scores/number of activities  Minimally Detectable Change: 3 points (for single activity); 2 points (for average score)  [facility] (nd). The Patient Specific Functional Scale . Retrieved from [email or web address]    COGNITION: 12/02/2023 Overall cognitive status: Within functional limits for tasks assessed     SENSATION: 12/02/2023 Sally Le has no complaints of peripheral pain or paresthesias  EDEMA:  12/02/2023 Noted but not objectively assessed  MUSCLE LENGTH: 12/02/2023 Hamstrings: Right 40 deg; Left 40 deg   LOWER EXTREMITY ROM:  ROM Right 12/02/2023 PROM Left 12/02/2023 PROM  Hip flexion 100/85 100  Hip extension    Hip abduction    Hip adduction    Hip internal rotation 7 14  Hip external rotation 7 30  Knee flexion    Knee extension    Ankle dorsiflexion 1 1  Ankle plantarflexion    Ankle inversion    Ankle eversion     (Blank rows = not tested)  LOWER EXTREMITY MMT:  MMT Right 12/02/2023 Left 12/02/2023  Hip flexion 4 4+  Hip extension    Hip abduction 3 4-  Hip adduction    Hip internal rotation    Hip external rotation    Knee flexion    Knee extension 4- 4-  Ankle dorsiflexion    Ankle plantarflexion    Ankle inversion    Ankle eversion     (Blank rows = not tested)  GAIT: 12/02/2023 Distance walked: 50 feet Assistive device utilized: Environmental Consultant - 2 wheeled Level of assistance: Modified independence Comments: Sally Le has been using a wheeled walker since surgery                   TREATMENT        DATE: 12/21/23 TherEx:  Recumbent bike level 3 for 8 minutes   TherAct:  Patient performed stairs with SPC and one handrails with step to pattern; began with CGA though decreased to SBA secondary to appropriate and steady performance  Bilat leg press 2x15 with 50#  Rt leg press 2x15 with 18#  Seated reaching to floor to simulate picking something up/putting on socks 1x8    TREATMENT        DATE: 12/16/23 Gait Training:  PT educated and demonstrated on on 3-point vs modified 2-point technique Patient ambulated with SPC with stand alone tip and SBA for 99.5' using modified 2-point technique ; no overt gait deviations or large differences between Rt and Lt  step lengths and stance times  Patient ambulated without AD and CGA for 99.5' with downward gaze and fearful gait, though no overt trunk lean or antalgic mechanics  Discussed with patient where to find the stand alone tip, how to apply, appropriate height for cane for optimal mechanics, and transition to mostly full time use of cane with use of RW only in times when feeling unstable/sore/tired/long distances  TherEx: Nustep level 4 for 8 minutes with bilat UE and LE  Sit<>stands from mat table 2x12 with no UE use   Step ups with Rt LE using 4 step 2x8 with UE use throughout  PT discussed protocol/precautions following anterior hip replacements and when to follow up with MD about  questions    TREATMENT        DATE: 12/14/23 R THA TherEx: UBE with bilat UE and LE for 4 minutes then bilat LE only for 4 minutes  Slant board gastroc stretch 3x30s  Sit<>stands from mat table 2x10 with no UE use  Supine abduction with green TB 2x12  Supine SLR with focus on quad activation ; discontinued secondary to difficulty and discomfort  Seated marches 1x10, 1x10 with 2# weights  Seated LAQ with 2# weights 2x10 each side   Self-Care:  PT discussed typical recovery timeline and pain levels, return to prior exercises, and what to expect with transitioning through ADs  TREATMENT        DATE: 12/09/23 R THA Therapeutic exercises: SciFit bike seat 10 level 1 BLEs & BUEs 6 min, then BLEs only for 2 min. RLE Supine bridge 10 reps, RLE Supine bridge with hip abd/add 10 reps Supine heel slide with ball under RLE strap assist max flexion to leg press/ knee ext 10 reps 2 sets `  Neuromuscular Re-education: Modified tandem RLE forward & backwad 30 sec each Rocker board (square 2 pivot points) ant/midline/post & right/midline/left 1 min ea with BUE support //bars and mirror for visual feedback for posture Standing crossways on foam beam to facilitate hip strategy feet hip width apart mirror for visual feedback  & PT tactile cues / supervision: 5 reps ea head motion right/midline/left, up/midline/down and diagonals. Stepping alternating LEs and un-weighting contralateral LE with light UE support: 10 reps ea forward, lateral & backwards.   Gait Training: Pt amb 25' without device with CGA.   PT recommended walking single aisle in grocery store pushing cart (her husband has power cart) then switch for 2-3 aisles to rest.  Goal is 3 single aisles initially.     PATIENT EDUCATION:  12/02/2023 Education details: See above Person educated: Patient and Spouse Education method: Explanation, Demonstration, Tactile cues, Verbal cues, and Handouts Education comprehension: verbalized understanding, returned demonstration, verbal cues required, tactile cues required, and needs further education  HOME EXERCISE PROGRAM: Access Code: [email or web address] URL: [email or web address] Date: 12/02/2023 Prepared by: Lamar Ivory  Exercises - Yoga Bridge  - 2 x daily - 7 x weekly - 2 sets - 10 reps - 3-5 seconds hold - Supine Figure 4 Piriformis Stretch  - 2 x daily - 7 x weekly - 1 sets - 5 reps - 20 seconds hold - Slant Board Gastrocnemius Stretch  - 3 x daily - 7 x weekly - 1 sets - 3 reps - 60 seconds hold - Slant Board Soleus Stretch  - 3 x daily - 7 x weekly - 1 sets - 3 reps - 60 seconds hold  ASSESSMENT:  CLINICAL IMPRESSION: Patient arrived to session noting intermittent discomfort in mid thigh, though no pain in Rt hip. Patient tolerated all activities this date with only slight pinching sensation with increased hip flexion. Patient will continue to benefit from skilled PT.   OBJECTIVE IMPAIRMENTS: Abnormal gait, decreased activity tolerance, decreased endurance, decreased knowledge of condition, difficulty walking, decreased ROM, decreased strength, increased edema, impaired perceived functional ability, and pain.   ACTIVITY LIMITATIONS: carrying, lifting, bending, sitting, standing, squatting,  sleeping, stairs, bed mobility, dressing, and locomotion level  PARTICIPATION LIMITATIONS: meal prep, cleaning, laundry, driving, shopping, and community activity  PERSONAL FACTORS: Venous insufficiency may also affect Jewel's functional outcome.   REHAB POTENTIAL: Good  CLINICAL DECISION MAKING: Stable/uncomplicated  EVALUATION COMPLEXITY: Low   GOALS: Goals reviewed with patient? Yes  SHORT  TERM GOALS: Target date: 12/30/2023 Keirsten will be independent with her day 1 home exercise program  Goal status: Ongoing   12/09/2023  2.  Improve right hip flexion to at least 90 degrees; improve right hip external rotation to at least 20 degrees and bilateral ankle dorsiflexion to at least 5 degrees  Goal status: Ongoing   12/09/2023  3.  Aleeya will be able to walk with a single-point cane and the free from the wheeled walker for 100% of her ambulation  Goal status: Ongoing   12/09/2023   LONG TERM GOALS: Target date: 01/27/2024  Improve patient specific functional score to at least 6 Baseline: 1 Goal status: Ongoing   12/09/2023  2.  Improve right hip and foot pain to no greater than 2/10 on the numeric pain rating scale Baseline: Foot as high as 7/10 Goal status: Ongoing   12/09/2023  3.  Improve right hip flexion to at least 95 degrees; right hip external rotation to at least 25 degrees and bilateral ankle dorsiflexion active range of motion to at least 10 degrees Baseline: 85; 7 and 1/-1 respectively Goal status: Ongoing   12/09/2023  4.  Improve right hip strength to at least 4+/5 MMT for hip flexion, quadriceps and hip abductors Baseline: See objective Goal status: Ongoing   12/09/2023  5.  Lenzie will be able to walk without an assistive device or noticeable gait deviation Baseline: Wheeled walker her percent of the time Goal status: Ongoing   12/09/2023  6.  Care will be independent with her long-term maintenance home exercise program at discharge Baseline: Started  12/02/2023 Goal status: Ongoing   12/09/2023   PLAN:  PT FREQUENCY: 1-2x/week  PT DURATION: 8 weeks  PLANNED INTERVENTIONS: 97750- Physical Performance Testing, 97110-Therapeutic exercises, 97530- Therapeutic activity, 97112- Neuromuscular re-education, 97535- Self Care, [id number]- Gait training, Patient/Family education, Balance training, Stair training, and Cryotherapy  PLAN FOR NEXT SESSION:    Balance activities, general LE strengthening, functional activities    Susannah Daring, PT, DPT 12/21/23 12:50 PM

## 2023-12-21 ENCOUNTER — Encounter: Payer: Self-pay | Admitting: Radiology

## 2023-12-21 ENCOUNTER — Ambulatory Visit

## 2023-12-21 DIAGNOSIS — M25651 Stiffness of right hip, not elsewhere classified: Secondary | ICD-10-CM | POA: Diagnosis not present

## 2023-12-21 DIAGNOSIS — M6281 Muscle weakness (generalized): Secondary | ICD-10-CM | POA: Diagnosis not present

## 2023-12-21 DIAGNOSIS — M25551 Pain in right hip: Secondary | ICD-10-CM

## 2023-12-21 DIAGNOSIS — R262 Difficulty in walking, not elsewhere classified: Secondary | ICD-10-CM

## 2023-12-22 ENCOUNTER — Other Ambulatory Visit: Payer: Self-pay

## 2023-12-22 ENCOUNTER — Ambulatory Visit: Admitting: Physician Assistant

## 2023-12-22 DIAGNOSIS — S72011A Unspecified intracapsular fracture of right femur, initial encounter for closed fracture: Secondary | ICD-10-CM

## 2023-12-22 DIAGNOSIS — Z96641 Presence of right artificial hip joint: Secondary | ICD-10-CM

## 2023-12-22 NOTE — Progress Notes (Signed)
 Post-Op Visit Note   Patient: Sally Le           Date of Birth: [birth year]           MRN: [id number] Visit Date: 12/22/2023 PCP: Alvia Corean CROME, FNP   Assessment & Plan:  Chief Complaint:  Chief Complaint  Patient presents with   Right Hip - Follow-up    Right THA 11/10/2023   Visit Diagnoses:  1. Closed subcapital fracture of neck of femur, right, initial encounter (HCC)   2. History of total hip replacement, right     Plan: Patient is a very pleasant 68 year old female who comes in today 6 weeks status post right total hip replacement.  She has been doing well.  She is not in any pain.  She has been in physical therapy where she is currently ambulating with a single-point cane.  Right hip exam: Painless hip flexion and logroll.  She is neurovascularly intact distally.  This point, she will continue with PT and a home exercise program.  Dental prophylaxis reinforced.  Follow-up in 6 weeks for repeat evaluation and AP pelvis x-rays.  Call with concerns or questions.  Follow-Up Instructions: Return in about 6 weeks (around 02/02/2024).   Orders:  Orders Placed This Encounter  Procedures   XR Pelvis 1-2 Views   No orders of the defined types were placed in this encounter.   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, pre-basal cell RIGHT cheek   Urticaria    Venous  insufficiency    Vitamin D  deficiency     Family History  Problem Relation Age of Onset   Parkinson's disease Mother    Breast cancer Mother    Cancer Mother 36       Estrogen and progestin positive breast cancer   Cancer Father        oat cell lung CA with mets to brain   Pancreatic cancer Maternal Grandfather    Cancer Maternal Grandfather        pancreatic   Obesity Sister    Spondylolisthesis Sister    Asthma Sister    Cancer Maternal Aunt 25       lung cancer (non-smoker)   Heart disease Paternal Grandmother    Lung cancer Unknown     Past Surgical History:  Procedure Laterality Date   ADENOIDECTOMY     APPENDECTOMY     DERMOID CYST  EXCISION     TONSILLECTOMY     TONSILLECTOMY     TOTAL HIP ARTHROPLASTY Right 11/10/2023   Procedure: ARTHROPLASTY, HIP, TOTAL, ANTERIOR APPROACH;  Surgeon: Jerri Kay HERO, MD;  Location: [facility];  Service: Orthopedics;  Laterality: Right;   Social History   Occupational History   Occupation: Magazine Features Editor: [employer]  Tobacco Use   Smoking status: Never   Smokeless tobacco: Never  Vaping Use   Vaping  status: Never Used  Substance and Sexual Activity   Alcohol  use: Yes    Alcohol /week: 1.0 standard drink of alcohol     Types: 1 Glasses of wine per week   Drug use: No   Sexual activity: Yes    Partners: Male    Birth control/protection: Post-menopausal

## 2023-12-22 NOTE — Therapy (Signed)
 OUTPATIENT PHYSICAL THERAPY TREATMENT   Patient Name: Sally Le MRN: [id number] DOB:[birth year], 68 y.o., female Today's Date: 12/23/2023  END OF SESSION:  PT End of Session - 12/23/23 1055     Visit Number 8    Number of Visits 16    Date for Recertification  01/27/24    Authorization Type [employer] $10 copay    PT Start Time 0928    PT Stop Time 1008    PT Time Calculation (min) 40 min    Activity Tolerance Patient tolerated treatment well    Behavior During Therapy [facility] for tasks assessed/performed             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   Procedure: ARTHROPLASTY, HIP, TOTAL, ANTERIOR APPROACH;  Surgeon: Sally Kay HERO, MD;  Location: [facility];  Service: Orthopedics;  Laterality: Right;   Patient Active Problem List   Diagnosis Date Noted   Closed subcapital fracture of neck of femur, right, initial encounter (HCC) 11/09/2023   Medication management 06/01/2023   Vitamin B12 deficiency 06/01/2023   History of drug allergy 01/11/2018   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  Walking with cane improving. PERTINENT HISTORY: Venous insufficiency  PAIN:  NPRS scale:  tenderness Pain location: Rt hip  Pain description: Ache and sore hip, sharp foot Aggravating factors: More with standing, wrong shoe Relieving factors: Get of foot, ice  PRECAUTIONS: Anterior hip  RED FLAGS: None   WEIGHT BEARING RESTRICTIONS: No  FALLS:  Has patient fallen in last 6 months? Yes. Number of falls 1  LIVING ENVIRONMENT: Lives with: lives with their family and lives with their spouse Lives in: House/apartment Stairs: Needs help with stairs Has following equipment at home: Single point cane and Environmental Consultant - 2 wheeled  OCCUPATION: Retired, works around the house  PLOF: Independent  PATIENT GOALS: Return to normal function; independent with chores; return to classical stretch  NEXT MD VISIT: 12/22/2023  OBJECTIVE:  Note: Objective measures were completed at Evaluation unless otherwise noted.  DIAGNOSTIC FINDINGS: Well-seated prosthesis without complication  PATIENT SURVEYS:  PSFS: THE PATIENT SPECIFIC FUNCTIONAL SCALE  Place score of 0-10 (0 = unable to perform activity and 10 = able to perform activity at the same level as before injury or problem)  Activity Date: 12/02/2023    Normal exercise program 0    2.  Get down to the floor to put on shoes 0  3.  Changing sheets and doing laundry 3    4.      Total Score 1      Total Score = Sum of activity scores/number of activities  Minimally Detectable Change: 3 points (for single activity); 2 points (for average score)  [facility] (nd). The Patient Specific Functional Scale . Retrieved from [email or web address]   COGNITION: 12/02/2023 Overall cognitive status: Within functional limits for tasks assessed     SENSATION: 12/02/2023 [name] has no  complaints of peripheral pain or paresthesias  EDEMA:  12/02/2023 Noted but not objectively assessed  MUSCLE LENGTH: 12/02/2023 Hamstrings: Right 40 deg; Left 40 deg   LOWER EXTREMITY ROM:  ROM Right 12/02/2023 PROM Left 12/02/2023 PROM  Hip flexion 100/85 100  Hip extension    Hip abduction    Hip adduction    Hip internal rotation 7 14  Hip external rotation 7 30  Knee flexion    Knee extension    Ankle dorsiflexion 1 1  Ankle plantarflexion    Ankle inversion    Ankle eversion     (Blank rows = not tested)  LOWER EXTREMITY MMT:  MMT Right 12/02/2023 Left 12/02/2023  Hip flexion 4 4+  Hip extension    Hip abduction 3 4-  Hip adduction    Hip internal rotation    Hip external rotation    Knee flexion    Knee extension 4- 4-  Ankle dorsiflexion    Ankle plantarflexion    Ankle inversion    Ankle eversion     (Blank rows = not tested)  GAIT: 12/02/2023 Distance walked: 50 feet Assistive device utilized: Environmental Consultant - 2 wheeled Level of assistance: Modified independence Comments: [name] has been using a wheeled walker since surgery                   TREATMENT   RTHA     DATE: 12/23/23 TherEx:  Recumbent bike level 3 for 8 minutes  Rt leg press 2x15 with 25#   TherAct:  Patient performed stairs with SPC and one handrails with step to pattern;  tSBA secondary to appropriate and steady performance  Bilat leg press 2x15 with 50#  Sit to stand 2# 2x10 no UE Step ups and taps on 6 step  Gait with SPC  TREATMENT        DATE: 12/21/23 TherEx:  Recumbent bike level 3 for 8 minutes   TherAct:  Patient performed stairs with SPC and one handrails with step to pattern; began with CGA though decreased to SBA secondary to appropriate and steady performance  Bilat leg press 2x15 with 50#  Rt leg press 2x15 with 18#  Seated reaching to floor to simulate picking something up/putting on socks 1x8    TREATMENT        DATE: 12/16/23 Gait Training:  PT educated and  demonstrated on on 3-point vs modified 2-point technique Patient ambulated with SPC with stand alone tip and SBA for 99.5' using modified 2-point technique ; no overt gait deviations or large differences between Rt and Lt step lengths and stance times  Patient ambulated without AD and CGA for 99.5' with downward gaze and fearful gait, though no overt trunk lean or antalgic mechanics  Discussed with patient where to find the stand alone tip, how to apply, appropriate height for cane for optimal mechanics, and transition to mostly full time use of cane with use of RW only in times when feeling unstable/sore/tired/long distances  TherEx: Nustep  level 4 for 8 minutes with bilat UE and LE  Sit<>stands from mat table 2x12 with no UE use   Step ups with Rt LE using 4 step 2x8 with UE use throughout  PT discussed protocol/precautions following anterior hip replacements and when to follow up with MD about questions    TREATMENT        DATE: 12/14/23 R THA TherEx: UBE with bilat UE and LE for 4 minutes then bilat LE only for 4 minutes  Slant board gastroc stretch 3x30s  Sit<>stands from mat table 2x10 with no UE use  Supine abduction with green TB 2x12  Supine SLR with focus on quad activation ; discontinued secondary to difficulty and discomfort  Seated marches 1x10, 1x10 with 2# weights  Seated LAQ with 2# weights 2x10 each side   Self-Care:  PT discussed typical recovery timeline and pain levels, return to prior exercises, and what to expect with transitioning through ADs  TREATMENT        DATE: 12/09/23 R THA Therapeutic exercises: SciFit bike seat 10 level 1 BLEs & BUEs 6 min, then BLEs only for 2 min. RLE Supine bridge 10 reps, RLE Supine bridge with hip abd/add 10 reps Supine heel slide with ball under RLE strap assist max flexion to leg press/ knee ext 10 reps 2 sets `  Neuromuscular Re-education: Modified tandem RLE forward & backwad 30 sec each Rocker board (square 2 pivot  points) ant/midline/post & right/midline/left 1 min ea with BUE support //bars and mirror for visual feedback for posture Standing crossways on foam beam to facilitate hip strategy feet hip width apart mirror for visual feedback & PT tactile cues / supervision: 5 reps ea head motion right/midline/left, up/midline/down and diagonals. Stepping alternating LEs and un-weighting contralateral LE with light UE support: 10 reps ea forward, lateral & backwards.   Gait Training: Pt amb 25' without device with CGA.   PT recommended walking single aisle in grocery store pushing cart (her husband has power cart) then switch for 2-3 aisles to rest.  Goal is 3 single aisles initially.     PATIENT EDUCATION:  12/02/2023 Education details: See above Person educated: Patient and Spouse Education method: Explanation, Demonstration, Tactile cues, Verbal cues, and Handouts Education comprehension: verbalized understanding, returned demonstration, verbal cues required, tactile cues required, and needs further education  HOME EXERCISE PROGRAM: Access Code: [email or web address] URL: [email or web address] Date: 12/02/2023 Prepared by: Lamar Ivory  Exercises - Yoga Bridge  - 2 x daily - 7 x weekly - 2 sets - 10 reps - 3-5 seconds hold - Supine Figure 4 Piriformis Stretch  - 2 x daily - 7 x weekly - 1 sets - 5 reps - 20 seconds hold - Slant Board Gastrocnemius Stretch  - 3 x daily - 7 x weekly - 1 sets - 3 reps - 60 seconds hold - Slant Board Soleus Stretch  - 3 x daily - 7 x weekly - 1 sets - 3 reps - 60 seconds hold  ASSESSMENT:  CLINICAL IMPRESSION: Patient needed VC for proper form with step ups- foot neutral.  OBJECTIVE IMPAIRMENTS: Abnormal gait, decreased activity tolerance, decreased endurance, decreased knowledge of condition, difficulty walking, decreased ROM, decreased strength, increased edema, impaired perceived functional ability, and pain.   ACTIVITY LIMITATIONS: carrying, lifting,  bending, sitting, standing, squatting, sleeping, stairs, bed mobility, dressing, and locomotion level  PARTICIPATION LIMITATIONS: meal prep, cleaning, laundry, driving, shopping, and community activity  PERSONAL FACTORS: Venous insufficiency may also affect Shannon's functional outcome.  REHAB POTENTIAL: Good  CLINICAL DECISION MAKING: Stable/uncomplicated  EVALUATION COMPLEXITY: Low   GOALS: Goals reviewed with patient? Yes  SHORT TERM GOALS: Target date: 12/30/2023 Jeanet will be independent with her day 1 home exercise program  Goal status: Ongoing   12/09/2023  2.  Improve right hip flexion to at least 90 degrees; improve right hip external rotation to at least 20 degrees and bilateral ankle dorsiflexion to at least 5 degrees  Goal status: Ongoing   12/09/2023  3.  Gianne will be able to walk with a single-point cane and the free from the wheeled walker for 100% of her ambulation  Goal status: Ongoing   12/09/2023   LONG TERM GOALS: Target date: 01/27/2024  Improve patient specific functional score to at least 6 Baseline: 1 Goal status: Ongoing   12/09/2023  2.  Improve right hip and foot pain to no greater than 2/10 on the numeric pain rating scale Baseline: Foot as high as 7/10 Goal status: Ongoing   12/09/2023  3.  Improve right hip flexion to at least 95 degrees; right hip external rotation to at least 25 degrees and bilateral ankle dorsiflexion active range of motion to at least 10 degrees Baseline: 85; 7 and 1/-1 respectively Goal status: Ongoing   12/09/2023  4.  Improve right hip strength to at least 4+/5 MMT for hip flexion, quadriceps and hip abductors Baseline: See objective Goal status: Ongoing   12/09/2023  5.  Holland will be able to walk without an assistive device or noticeable gait deviation Baseline: Wheeled walker her percent of the time Goal status: Ongoing   12/09/2023  6.  Care will be independent with her long-term maintenance home exercise  program at discharge Baseline: Started 12/02/2023 Goal status: Ongoing   12/09/2023   PLAN:  PT FREQUENCY: 1-2x/week  PT DURATION: 8 weeks  PLANNED INTERVENTIONS: 97750- Physical Performance Testing, 97110-Therapeutic exercises, 97530- Therapeutic activity, 97112- Neuromuscular re-education, 97535- Self Care, 02883- Gait training, Patient/Family education, Balance training, Stair training, and Cryotherapy  PLAN FOR NEXT SESSION:    Balance activities, general LE strengthening, functional activities   Burnard Meth, PT 12/23/23  10:56 AM

## 2023-12-23 ENCOUNTER — Ambulatory Visit: Payer: Self-pay

## 2023-12-23 ENCOUNTER — Ambulatory Visit

## 2023-12-23 DIAGNOSIS — M25551 Pain in right hip: Secondary | ICD-10-CM

## 2023-12-23 DIAGNOSIS — M6281 Muscle weakness (generalized): Secondary | ICD-10-CM

## 2023-12-23 DIAGNOSIS — M25651 Stiffness of right hip, not elsewhere classified: Secondary | ICD-10-CM | POA: Diagnosis not present

## 2023-12-23 DIAGNOSIS — R262 Difficulty in walking, not elsewhere classified: Secondary | ICD-10-CM

## 2023-12-23 NOTE — Telephone Encounter (Signed)
 FYI Only or Action Required?: FYI only for provider: appointment scheduled on 12/24/23.  Patient was last seen in primary care on 06/01/2023 by Alvia Corean CROME, FNP.  Called Nurse Triage reporting Back Pain.  Symptoms began several days ago.  Interventions attempted: Rest, hydration, or home remedies.  Symptoms are: unchanged.  Triage Disposition: See PCP When Office is Open (Within 3 Days)  Patient/caregiver understands and will follow disposition?: Yes  Copied from CRM [id number]. Topic: Clinical - Red Word Triage >> Dec 23, 2023  1:17 PM Avram MATSU wrote: Red Word that prompted transfer to Nurse Triage: lower back pain on and off and feel it in her kidneys Reason for Disposition  [1] MODERATE back pain (e.g., interferes with normal activities) AND [2] present > 3 days  Answer Assessment - Initial Assessment Questions 1. ONSET: When did the pain begin? (e.g., minutes, hours, days)     Several days  2. LOCATION: Where does it hurt? (upper, mid or lower back)     Mid to lower back wraps around to abdomen 3. SEVERITY: How bad is the pain?  (e.g., Scale 1-10; mild, moderate, or severe)     moderate 4. PATTERN: Is the pain constant? (e.g., yes, no; constant, intermittent)      Intermittent  5. RADIATION: Does the pain shoot into your legs or somewhere else?     To flank and abdomen, feels similar to kidney stone 6. CAUSE:  What do you think is causing the back pain?      Kidney  7. BACK OVERUSE:  Any recent lifting of heavy objects, strenuous work or exercise?     Currently in PT post hip surgery  8. MEDICINES: What have you taken so far for the pain? (e.g., nothing, acetaminophen , NSAIDS)     Nothing  9. NEUROLOGIC SYMPTOMS: Do you have any weakness, numbness, or problems with bowel/bladder control?     Denies  10. OTHER SYMPTOMS: Do you have any other symptoms? (e.g., fever, abdomen pain, burning with urination, blood in urine)     History of kidney  stones, vomited on Sunday X 1  Protocols used: Back Pain-A-AH

## 2023-12-24 ENCOUNTER — Ambulatory Visit: Admitting: Family Medicine

## 2023-12-24 NOTE — Progress Notes (Deleted)
   Acute Office Visit  Subjective:     Patient ID: Sally Le, female    DOB: 11-Jan-1956, 68 y.o.   MRN: [id number]  No chief complaint on file.   HPI  Discussed the use of AI scribe software for clinical note transcription with the patient, who gave verbal consent to proceed.  History of Present Illness      ROS Per HPI      Objective:    LMP 06/05/2011    Physical Exam Vitals and nursing note reviewed.  Constitutional:      General: She is not in acute distress.    Appearance: Normal appearance. She is normal weight.  HENT:     Head: Normocephalic and atraumatic.     Right Ear: External ear normal.     Left Ear: External ear normal.     Nose: Nose normal.     Mouth/Throat:     Mouth: Mucous membranes are moist.     Pharynx: Oropharynx is clear.  Eyes:     Extraocular Movements: Extraocular movements intact.     Pupils: Pupils are equal, round, and reactive to light.  Cardiovascular:     Rate and Rhythm: Normal rate and regular rhythm.     Pulses: Normal pulses.     Heart sounds: Normal heart sounds.  Pulmonary:     Effort: Pulmonary effort is normal. No respiratory distress.     Breath sounds: Normal breath sounds. No wheezing, rhonchi or rales.  Musculoskeletal:        General: Normal range of motion.     Cervical back: Normal range of motion.     Right lower leg: No edema.     Left lower leg: No edema.  Lymphadenopathy:     Cervical: No cervical adenopathy.  Neurological:     General: No focal deficit present.     Mental Status: She is alert and oriented to person, place, and time.  Psychiatric:        Mood and Affect: Mood normal.        Thought Content: Thought content normal.     No results found for any visits on 12/24/23.      Assessment & Plan:   Assessment and Plan Assessment & Plan      No orders of the defined types were placed in this encounter.    No orders of the defined types were placed in this  encounter.   No follow-ups on file.  Corean LITTIE Ku, FNP

## 2023-12-25 ENCOUNTER — Telehealth: Payer: Self-pay

## 2023-12-25 ENCOUNTER — Ambulatory Visit (INDEPENDENT_AMBULATORY_CARE_PROVIDER_SITE_OTHER): Admitting: Family Medicine

## 2023-12-25 ENCOUNTER — Encounter: Payer: Self-pay | Admitting: Family Medicine

## 2023-12-25 VITALS — BP 132/70 | HR 81 | Temp 98.1°F | Ht 64.5 in | Wt 160.4 lb

## 2023-12-25 DIAGNOSIS — R195 Other fecal abnormalities: Secondary | ICD-10-CM | POA: Diagnosis not present

## 2023-12-25 DIAGNOSIS — R10A1 Flank pain, right side: Secondary | ICD-10-CM

## 2023-12-25 LAB — POCT URINALYSIS DIP (CLINITEK)
Bilirubin, UA: NEGATIVE
Glucose, UA: NEGATIVE mg/dL
Ketones, POC UA: NEGATIVE mg/dL
Leukocytes, UA: NEGATIVE
Nitrite, UA: NEGATIVE
Spec Grav, UA: >=1.03 — AB (ref 1.010–1.025)
Urobilinogen, UA: 0.2 U/dL
pH, UA: 5.5 (ref 5.0–8.0)

## 2023-12-25 MED ORDER — TAMSULOSIN HCL 0.4 MG PO CAPS: 0.4000 mg | ORAL_CAPSULE | Freq: Every day | ORAL | 3 refills | Status: AC

## 2023-12-25 MED ORDER — TIZANIDINE HCL 4 MG PO TABS: 4.0000 mg | ORAL_TABLET | Freq: Four times a day (QID) | ORAL | 0 refills | Status: AC | PRN

## 2023-12-25 NOTE — Progress Notes (Signed)
 Acute Office Visit  Subjective:     Patient ID: Sally Le, female    DOB: April 25, 1955, 68 y.o.   MRN: [id number]  Chief Complaint  Patient presents with   Acute Visit    HPI  Discussed the use of AI scribe software for clinical note transcription with the patient, who gave verbal consent to proceed.  History of Present Illness Sally Le is a 68 year old female with a history of kidney stones who presents with back pain radiating to her flank.  Flank and back pain - Sudden onset back pain radiating to the flank, beginning on Sunday - Pain described as a spasm traveling downward, causing pelvic pain with pressure, pinching, and stinging sensations - Occasional twinges under the ribs - Pain severe enough to induce nausea and vomiting after eating - Walking and drinking water provide partial relief - Pain subsided after two hours, allowing for restful sleep and improvement the following day - Pain recurred on Wednesday with increased intensity  Hematuria - Noticed a small amount of blood in the urine during symptomatic episodes  History of nephrolithiasis - Two previous episodes of kidney stones: one in the 1980s and another approximately ten years ago - Increasing water intake to manage symptoms  Medication use and adverse effects - Not taking muscle relaxers regularly since right hip surgery - Used muscle relaxers briefly post-operatively - Experienced nausea with a half dose of muscle relaxer and is cautious about further use due to previous reaction  Postoperative recovery - Underwent right hip replacement following a fall - Recovering well and attending physical therapy     ROS Per HPI      Objective:    BP 132/70 (BP Location: Left Arm, Patient Position: Sitting)   Pulse 81   Temp 98.1 F (36.7 C) (Temporal)   Ht 5' 4.5 (1.638 m)   Wt 160 lb 6.4 oz (72.8 kg)   LMP 06/05/2011   SpO2 95%   BMI 27.11 kg/m    Physical Exam Vitals and  nursing note reviewed.  Constitutional:      General: She is not in acute distress.    Appearance: Normal appearance. She is obese.  HENT:     Head: Normocephalic and atraumatic.     Right Ear: External ear normal.     Left Ear: External ear normal.     Nose: Nose normal.     Mouth/Throat:     Mouth: Mucous membranes are moist.     Pharynx: Oropharynx is clear.  Eyes:     Extraocular Movements: Extraocular movements intact.     Pupils: Pupils are equal, round, and reactive to light.  Cardiovascular:     Rate and Rhythm: Normal rate and regular rhythm.     Pulses: Normal pulses.     Heart sounds:     No friction rub.  Pulmonary:     Effort: Pulmonary effort is normal. No respiratory distress.     Breath sounds: Normal breath sounds. No wheezing, rhonchi or rales.  Abdominal:     General: There is no distension.     Palpations: There is no mass.     Tenderness: There is no abdominal tenderness. There is no right CVA tenderness, left CVA tenderness, guarding or rebound.     Hernia: No hernia is present.  Musculoskeletal:     Cervical back: Normal range of motion.     Right lower leg: No edema.     Left lower leg: No  edema.     Comments: Using 4 point cane to assist ambulation, limping to favor L hip  Lymphadenopathy:     Cervical: No cervical adenopathy.  Neurological:     General: No focal deficit present.     Mental Status: She is alert and oriented to person, place, and time.  Psychiatric:        Mood and Affect: Mood normal.        Thought Content: Thought content normal.     Results for orders placed or performed in visit on 12/25/23  POCT URINALYSIS DIP (CLINITEK)  Result Value Ref Range   Color, UA yellow yellow   Clarity, UA clear clear   Glucose, UA negative negative mg/dL   Bilirubin, UA negative negative   Ketones, POC UA negative negative mg/dL   Spec Grav, UA >=8.969 (A) 1.010 - 1.025   Blood, UA small (A) negative   pH, UA 5.5 5.0 - 8.0   POC  PROTEIN,UA trace negative, trace   Urobilinogen, UA 0.2 0.2 or 1.0 E.U./dL   Nitrite, UA Negative Negative   Leukocytes, UA Negative Negative        Assessment & Plan:   Assessment and Plan Assessment & Plan Right flank pain possible nephrolithiasis Intermittent right flank pain with history of nephrolithiasis. Urinalysis shows hematuria, indicating possible nephrolithiasis. Previous adverse reaction to muscle relaxer noted. - Prescribed tizanidine as alternative muscle relaxer. - Consider Flomax for stone passage. - Ordered urine culture to rule out infection. - Advised ER visit for CT scan if pain worsens.  Loose stools possible post-surgical gastrointestinal disturbance Loose stools post-surgery, possibly related to gastrointestinal disturbance. Symptoms include loose stools after eating, especially dairy. - Recommended probiotic. - Consider stool study if no improvement with probiotic.     Orders Placed This Encounter  Procedures   Urine Culture    Standing Status:   Future    Expected Date:   12/25/2023    Expiration Date:   12/24/2024   POCT URINALYSIS DIP (CLINITEK)     Meds ordered this encounter  Medications   tiZANidine (ZANAFLEX) 4 MG tablet    Sig: Take 1 tablet (4 mg total) by mouth every 6 (six) hours as needed for muscle spasms.    Dispense:  30 tablet    Refill:  0   tamsulosin (FLOMAX) 0.4 MG CAPS capsule    Sig: Take 1 capsule (0.4 mg total) by mouth daily.    Dispense:  30 capsule    Refill:  3    Return if symptoms worsen or fail to improve.  Corean LITTIE Ku, FNP

## 2023-12-25 NOTE — Telephone Encounter (Signed)
 Copied from CRM #[id number]. Topic: Clinical - Medical Advice >> Dec 25, 2023 12:14 PM Ashley R wrote: Reason for CRM: Follow-up to todays appt. Requesting Callback [phone] if Covid Vaccine Tuesday, given symptoms discussed during visit. VM OK

## 2023-12-25 NOTE — Patient Instructions (Signed)
 May take tizanidine one tablet twice a day as needed.  May take flomax once daily.   If pain worsens over the weekend, I would like you to follow up at [facility] for possible CT.  Follow-up with me for new or worsening symptoms.

## 2023-12-25 NOTE — Therapy (Signed)
 OUTPATIENT PHYSICAL THERAPY TREATMENT   Patient Name: Sally Le MRN: [id number] DOB:[birth year], 68 y.o., female Today's Date: 12/28/2023  END OF SESSION:  PT End of Session - 12/28/23 0931     Visit Number 9    Number of Visits 16    Date for Recertification  01/27/24    Authorization Type [employer] $10 copay    PT Start Time 0931    PT Stop Time 1013    PT Time Calculation (min) 42 min    Activity Tolerance Patient tolerated treatment well    Behavior During Therapy [facility] for tasks assessed/performed              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   Procedure: ARTHROPLASTY, HIP, TOTAL, ANTERIOR APPROACH;  Surgeon: Jerri Kay HERO, MD;  Location: [facility];  Service: Orthopedics;  Laterality: Right;   Patient Active Problem List   Diagnosis Date Noted   Closed subcapital fracture of neck of femur, right, initial encounter (HCC) 11/09/2023   Medication management 06/01/2023   Vitamin B12 deficiency 06/01/2023   History of drug allergy 01/11/2018   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   Allergic reaction 05/05/2017   Drug allergy 05/05/2017   Seborrheic dermatitis 02/26/2012    PCP: Corean LITTIE Ku, FNP  REFERRING PROVIDER: Ronal LITTIE Grave PA-C  REFERRING DIAG:  [id number] (ICD-10-CM) - History of total hip replacement, right    THERAPY DIAG:  Pain in right hip  Difficulty in walking, not elsewhere classified  Muscle weakness (generalized)  Stiffness of right hip, not elsewhere classified  Rationale for Evaluation and Treatment: Rehabilitation  ONSET DATE: 11/09/2023  fall, THA 11/10/2023  SUBJECTIVE:   SUBJECTIVE STATEMENT: Patient reports some tenderness in anterior hip.   PERTINENT HISTORY: Venous insufficiency  PAIN:  NPRS scale:  tenderness Pain location: Rt hip  Pain description: Ache and sore hip, sharp foot Aggravating factors: More with standing, wrong shoe Relieving factors: Get of foot, ice  PRECAUTIONS: Anterior hip  RED FLAGS: None   WEIGHT BEARING RESTRICTIONS: No  FALLS:  Has patient fallen in last 6 months? Yes. Number of falls 1  LIVING ENVIRONMENT: Lives with: lives with their family and lives with their spouse Lives in: House/apartment Stairs: Needs help with stairs Has following equipment at home: Single point cane and Environmental Consultant - 2 wheeled  OCCUPATION: Retired, works around the house  PLOF: Independent  PATIENT GOALS: Return to normal function; independent with chores; return to classical stretch  NEXT MD VISIT: 12/22/2023  OBJECTIVE:  Note: Objective measures were completed at Evaluation unless otherwise noted.  DIAGNOSTIC FINDINGS: Well-seated prosthesis without complication  PATIENT SURVEYS:  PSFS: THE PATIENT SPECIFIC FUNCTIONAL SCALE  Place score of 0-10 (0 = unable to perform activity and 10 = able to perform activity at the same level as before injury or problem)  Activity Date: 12/02/2023    Normal exercise program 0    2.  Get down to the floor to put on shoes 0    3.  Changing sheets and doing laundry 3    4.      Total Score 1      Total Score = Sum of activity scores/number of activities  Minimally Detectable Change: 3 points (for single activity); 2 points (for average score)  [name] (nd). The Patient Specific Functional Scale . Retrieved from [email or web address]   COGNITION: 12/02/2023 Overall cognitive status: Within functional limits for tasks assessed     SENSATION: 12/02/2023 Magdala has no complaints of  peripheral pain or paresthesias  EDEMA:  12/02/2023 Noted but not objectively assessed  MUSCLE LENGTH: 12/02/2023 Hamstrings: Right 40 deg; Left 40 deg   LOWER EXTREMITY ROM:  ROM Right 12/02/2023 PROM Left 12/02/2023 PROM  Hip flexion 100/85 100  Hip extension    Hip abduction    Hip adduction    Hip internal rotation 7 14  Hip external rotation 7 30  Knee flexion    Knee extension    Ankle dorsiflexion 1 1  Ankle plantarflexion    Ankle inversion    Ankle eversion     (Blank rows = not tested)  LOWER EXTREMITY MMT:  MMT Right 12/02/2023 Left 12/02/2023  Hip flexion 4 4+  Hip extension    Hip abduction 3 4-  Hip adduction    Hip internal rotation    Hip external rotation    Knee flexion    Knee extension 4- 4-  Ankle dorsiflexion    Ankle plantarflexion    Ankle inversion    Ankle eversion     (Blank rows = not tested)  GAIT: 12/02/2023 Distance walked: 50 feet Assistive device utilized: Environmental Consultant - 2 wheeled Level of assistance: Modified independence Comments: Alisea has been using a wheeled walker since surgery                   TREATMENT   RTHA     DATE: 12/28/23 TherEx:  Recumbent bike level 3 for 8 minutes  Unilat leg press 2x12   TherAct:  Seated push the gas pedal with BOSU dome side up 1x15 with 2s hold  Seated push the gas pedal then the brake 1x15 with 2s hold in each spot  Seated push the gas pedal then quick to the brake 1x30 Bilat leg press 2x15 with 56#  Step up and over 6 step 1x10 with bilat UE use    TREATMENT   RTHA     DATE: 12/23/23 TherEx:  Recumbent bike level 3 for 8 minutes  Rt leg press 2x15 with 25#   TherAct:  Patient performed stairs with SPC and one handrails with step to pattern;  tSBA secondary to appropriate and steady performance  Bilat leg press 2x15 with 50#  Sit to stand 2# 2x10 no UE Step ups and taps on 6 step  Gait with SPC  TREATMENT        DATE: 12/21/23 TherEx:  Recumbent bike level 3 for  8 minutes   TherAct:  Patient performed stairs with SPC and one handrails with step to pattern; began with CGA though decreased to SBA secondary to appropriate and steady performance  Bilat leg press 2x15 with 50#  Rt leg press 2x15 with 18#  Seated reaching to floor to simulate picking something up/putting on socks 1x8    TREATMENT        DATE: 12/16/23 Gait Training:  PT educated and demonstrated on on 3-point vs modified 2-point technique Patient ambulated with SPC with stand alone tip and SBA for  99.5' using modified 2-point technique ; no overt gait deviations or large differences between Rt and Lt step lengths and stance times  Patient ambulated without AD and CGA for 99.5' with downward gaze and fearful gait, though no overt trunk lean or antalgic mechanics  Discussed with patient where to find the stand alone tip, how to apply, appropriate height for cane for optimal mechanics, and transition to mostly full time use of cane with use of RW only in times when feeling unstable/sore/tired/long distances  TherEx: Nustep level 4 for 8 minutes with bilat UE and LE  Sit<>stands from mat table 2x12 with no UE use   Step ups with Rt LE using 4 step 2x8 with UE use throughout  PT discussed protocol/precautions following anterior hip replacements and when to follow up with MD about questions        PATIENT EDUCATION:  12/02/2023 Education details: See above Person educated: Patient and Spouse Education method: Explanation, Demonstration, Actor cues, Verbal cues, and Handouts Education comprehension: verbalized understanding, returned demonstration, verbal cues required, tactile cues required, and needs further education  HOME EXERCISE PROGRAM: Access Code: [email or web address] URL: [email or web address] Date: 12/02/2023 Prepared by: Lamar Ivory  Exercises - Yoga Bridge  - 2 x daily - 7 x weekly - 2 sets - 10 reps - 3-5 seconds hold - Supine Figure 4 Piriformis Stretch  -  2 x daily - 7 x weekly - 1 sets - 5 reps - 20 seconds hold - Slant Board Gastrocnemius Stretch  - 3 x daily - 7 x weekly - 1 sets - 3 reps - 60 seconds hold - Slant Board Soleus Stretch  - 3 x daily - 7 x weekly - 1 sets - 3 reps - 60 seconds hold  ASSESSMENT:  CLINICAL IMPRESSION: Patient arrived to session noting slight tenderness, but no overt pain or difficulty with tasks. Patient tolerated all activities this date with continues tenderness, but no increases. Patient is still fearful of moving too fast with tasks at home and in therapy, though PT discussed great progress and began working on including driving type activities.  Patient will continue to benefit from skilled PT.  OBJECTIVE IMPAIRMENTS: Abnormal gait, decreased activity tolerance, decreased endurance, decreased knowledge of condition, difficulty walking, decreased ROM, decreased strength, increased edema, impaired perceived functional ability, and pain.   ACTIVITY LIMITATIONS: carrying, lifting, bending, sitting, standing, squatting, sleeping, stairs, bed mobility, dressing, and locomotion level  PARTICIPATION LIMITATIONS: meal prep, cleaning, laundry, driving, shopping, and community activity  PERSONAL FACTORS: Venous insufficiency may also affect Surah's functional outcome.   REHAB POTENTIAL: Good  CLINICAL DECISION MAKING: Stable/uncomplicated  EVALUATION COMPLEXITY: Low   GOALS: Goals reviewed with patient? Yes  SHORT TERM GOALS: Target date: 12/30/2023 Ashle will be independent with her day 1 home exercise program  Goal status: Ongoing   12/09/2023  2.  Improve right hip flexion to at least 90 degrees; improve right hip external rotation to at least 20 degrees and bilateral ankle dorsiflexion to at least 5 degrees  Goal status: Ongoing   12/09/2023  3.  Naijah will be able to walk with a single-point cane and the free from the wheeled walker for 100% of her ambulation  Goal status: Ongoing    12/09/2023   LONG TERM GOALS: Target date: 01/27/2024  Improve patient specific functional score to at least 6 Baseline: 1 Goal status: Ongoing   12/09/2023  2.  Improve right hip and foot pain to no greater than 2/10 on the numeric pain  rating scale Baseline: Foot as high as 7/10 Goal status: Ongoing   12/09/2023  3.  Improve right hip flexion to at least 95 degrees; right hip external rotation to at least 25 degrees and bilateral ankle dorsiflexion active range of motion to at least 10 degrees Baseline: 85; 7 and 1/-1 respectively Goal status: Ongoing   12/09/2023  4.  Improve right hip strength to at least 4+/5 MMT for hip flexion, quadriceps and hip abductors Baseline: See objective Goal status: Ongoing   12/09/2023  5.  Kemari will be able to walk without an assistive device or noticeable gait deviation Baseline: Wheeled walker her percent of the time Goal status: Ongoing   12/09/2023  6.  Care will be independent with her long-term maintenance home exercise program at discharge Baseline: Started 12/02/2023 Goal status: Ongoing   12/09/2023   PLAN:  PT FREQUENCY: 1-2x/week  PT DURATION: 8 weeks  PLANNED INTERVENTIONS: 97750- Physical Performance Testing, 97110-Therapeutic exercises, 97530- Therapeutic activity, 97112- Neuromuscular re-education, 97535- Self Care, [id number]- Gait training, Patient/Family education, Balance training, Stair training, and Cryotherapy  PLAN FOR NEXT SESSION:     Balance activities, general LE strengthening, functional activities   Susannah Daring, PT, DPT 12/28/23 10:18 AM

## 2023-12-26 LAB — URINE CULTURE

## 2023-12-28 ENCOUNTER — Ambulatory Visit

## 2023-12-28 ENCOUNTER — Ambulatory Visit: Payer: Self-pay | Admitting: Family Medicine

## 2023-12-28 DIAGNOSIS — R262 Difficulty in walking, not elsewhere classified: Secondary | ICD-10-CM

## 2023-12-28 DIAGNOSIS — M25551 Pain in right hip: Secondary | ICD-10-CM | POA: Diagnosis not present

## 2023-12-28 DIAGNOSIS — M6281 Muscle weakness (generalized): Secondary | ICD-10-CM

## 2023-12-28 DIAGNOSIS — M25651 Stiffness of right hip, not elsewhere classified: Secondary | ICD-10-CM

## 2023-12-28 NOTE — Telephone Encounter (Signed)
 Spoke with patient, she wanted to make sure she could get her covid vaccine. Let her know that should not bother anything.

## 2023-12-28 NOTE — Telephone Encounter (Signed)
 LVM for patient to return call, unsure what is being requested.

## 2023-12-28 NOTE — Telephone Encounter (Signed)
 Verbal from PCP also

## 2023-12-29 NOTE — Therapy (Signed)
 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   Procedure: ARTHROPLASTY, HIP, TOTAL, ANTERIOR APPROACH;  Surgeon: Sally Kay HERO, MD;  Location: [facility];  Service: Orthopedics;  Laterality: Right;   Patient Active Problem List   Diagnosis Date Noted   Closed subcapital fracture of neck of femur, right, initial encounter (HCC) 11/09/2023   Medication management 06/01/2023   Vitamin B12 deficiency 06/01/2023   History of drug allergy 01/11/2018   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  Rationale for Evaluation and Treatment: Rehabilitation  ONSET DATE: 11/09/2023 fall, THA 11/10/2023  SUBJECTIVE:   SUBJECTIVE STATEMENT: *** Patient reports some tenderness in anterior hip.   PERTINENT HISTORY: Venous insufficiency  PAIN:  NPRS scale:  tenderness Pain location: Rt hip  Pain description: Ache and sore hip, sharp foot Aggravating factors: More with standing, wrong shoe Relieving factors: Get of foot, ice  PRECAUTIONS: Anterior hip  RED FLAGS: None   WEIGHT BEARING RESTRICTIONS: No  FALLS:  Has patient fallen in last 6 months? Yes. Number  of falls 1  LIVING ENVIRONMENT: Lives with: lives with their family and lives with their spouse Lives in: House/apartment Stairs: Needs help with stairs Has following equipment at home: Single point cane and Environmental Consultant - 2 wheeled  OCCUPATION: Retired, works around the house  PLOF: Independent  PATIENT GOALS: Return to normal function; independent with chores; return to classical stretch  NEXT MD VISIT: 12/22/2023  OBJECTIVE:  Note: Objective measures were completed at Evaluation unless otherwise noted.  DIAGNOSTIC FINDINGS: Well-seated prosthesis without complication  PATIENT SURVEYS:  PSFS: THE PATIENT SPECIFIC FUNCTIONAL SCALE  Place score of 0-10 (0 = unable to perform activity and 10 = able to perform activity at the same level as before injury or problem)  Activity Date: 12/02/2023    Normal exercise program 0    2.  Get down to the floor to put on shoes 0    3.  Changing sheets and doing laundry 3    4.      Total Score 1      Total Score = Sum of activity scores/number of activities  Minimally Detectable Change: 3 points (for single activity); 2 points (for average score)  [facility] (nd). The Patient Specific Functional Scale . Retrieved from [email or web address]   COGNITION: 12/02/2023 Overall cognitive status: Within functional limits for tasks assessed     SENSATION: 12/02/2023 Sally Le has no complaints of peripheral pain or paresthesias  EDEMA:  12/02/2023 Noted but not objectively assessed  MUSCLE LENGTH: 12/02/2023 Hamstrings: Right 40  deg; Left 40 deg   LOWER EXTREMITY ROM:  ROM Right 12/02/2023 PROM Left 12/02/2023 PROM  Hip flexion 100/85 100  Hip extension    Hip abduction    Hip adduction    Hip internal rotation 7 14  Hip external rotation 7 30  Knee flexion    Knee extension    Ankle dorsiflexion 1 1  Ankle plantarflexion    Ankle inversion    Ankle eversion      (Blank rows = not tested)  LOWER EXTREMITY MMT:  MMT Right 12/02/2023 Left 12/02/2023  Hip flexion 4 4+  Hip extension    Hip abduction 3 4-  Hip adduction    Hip internal rotation    Hip external rotation    Knee flexion    Knee extension 4- 4-  Ankle dorsiflexion    Ankle plantarflexion    Ankle inversion    Ankle eversion     (Blank rows = not tested)  GAIT: 12/02/2023 Distance walked: 50 feet Assistive device utilized: Environmental Consultant - 2 wheeled Level of assistance: Modified independence Comments: Sally Le has been using a wheeled walker since surgery                   TREATMENT   RTHA     DATE: 12/30/23 ***   TREATMENT   RTHA     DATE: 12/28/23 TherEx:  Recumbent bike level 3 for 8 minutes  Unilat leg press 2x12   TherAct:  Seated push the gas pedal with BOSU dome side up 1x15 with 2s hold  Seated push the gas pedal then the brake 1x15 with 2s hold in each spot  Seated push the gas pedal then quick to the brake 1x30 Bilat leg press 2x15 with 56#  Step up and over 6 step 1x10 with bilat UE use    TREATMENT   RTHA     DATE: 12/23/23 TherEx:  Recumbent bike level 3 for 8 minutes  Rt leg press 2x15 with 25#   TherAct:  Patient performed stairs with SPC and one handrails with step to pattern;  tSBA secondary to appropriate and steady performance  Bilat leg press 2x15 with 50#  Sit to stand 2# 2x10 no UE Step ups and taps on 6 step  Gait with SPC  TREATMENT        DATE: 12/21/23 TherEx:  Recumbent bike level 3 for 8 minutes   TherAct:  Patient performed stairs with SPC and one handrails with step to pattern; began with CGA though decreased to SBA secondary to appropriate and steady performance  Bilat leg press 2x15 with 50#  Rt leg press 2x15 with 18#  Seated reaching to floor to simulate picking something up/putting on socks 1x8    TREATMENT        DATE: 12/16/23 Gait Training:  PT educated and demonstrated on on 3-point vs modified 2-point  technique Patient ambulated with SPC with stand alone tip and SBA for 99.5' using modified 2-point technique ; no overt gait deviations or large differences between Rt and Lt step lengths and stance times  Patient ambulated without AD and CGA for 99.5' with downward gaze and fearful gait, though no overt trunk lean or antalgic mechanics  Discussed with patient where to find the stand alone tip, how to apply, appropriate height for cane for optimal mechanics, and transition to mostly full time use of cane with use of RW only in times when feeling unstable/sore/tired/long distances  TherEx: Nustep level 4 for  8 minutes with bilat UE and LE  Sit<>stands from mat table 2x12 with no UE use   Step ups with Rt LE using 4 step 2x8 with UE use throughout  PT discussed protocol/precautions following anterior hip replacements and when to follow up with MD about questions        PATIENT EDUCATION:  12/02/2023 Education details: See above Person educated: Patient and Spouse Education method: Explanation, Demonstration, Actor cues, Verbal cues, and Handouts Education comprehension: verbalized understanding, returned demonstration, verbal cues required, tactile cues required, and needs further education  HOME EXERCISE PROGRAM: Access Code: [email or web address] URL: [email or web address] Date: 12/02/2023 Prepared by: Lamar Ivory  Exercises - Yoga Bridge  - 2 x daily - 7 x weekly - 2 sets - 10 reps - 3-5 seconds hold - Supine Figure 4 Piriformis Stretch  - 2 x daily - 7 x weekly - 1 sets - 5 reps - 20 seconds hold - Slant Board Gastrocnemius Stretch  - 3 x daily - 7 x weekly - 1 sets - 3 reps - 60 seconds hold - Slant Board Soleus Stretch  - 3 x daily - 7 x weekly - 1 sets - 3 reps - 60 seconds hold  ASSESSMENT:  CLINICAL IMPRESSION: *** Patient arrived to session noting slight tenderness, but no overt pain or difficulty with tasks. Patient tolerated all activities this date with  continues tenderness, but no increases. Patient is still fearful of moving too fast with tasks at home and in therapy, though PT discussed great progress and began working on including driving type activities.  Patient will continue to benefit from skilled PT.  OBJECTIVE IMPAIRMENTS: Abnormal gait, decreased activity tolerance, decreased endurance, decreased knowledge of condition, difficulty walking, decreased ROM, decreased strength, increased edema, impaired perceived functional ability, and pain.   ACTIVITY LIMITATIONS: carrying, lifting, bending, sitting, standing, squatting, sleeping, stairs, bed mobility, dressing, and locomotion level  PARTICIPATION LIMITATIONS: meal prep, cleaning, laundry, driving, shopping, and community activity  PERSONAL FACTORS: Venous insufficiency may also affect Levenia's functional outcome.   REHAB POTENTIAL: Good  CLINICAL DECISION MAKING: Stable/uncomplicated  EVALUATION COMPLEXITY: Low   GOALS: Goals reviewed with patient? Yes  SHORT TERM GOALS: Target date: 12/30/2023 Jozi will be independent with her day 1 home exercise program  Goal status: Ongoing   12/09/2023  2.  Improve right hip flexion to at least 90 degrees; improve right hip external rotation to at least 20 degrees and bilateral ankle dorsiflexion to at least 5 degrees  Goal status: Ongoing   12/09/2023  3.  Deriona will be able to walk with a single-point cane and the free from the wheeled walker for 100% of her ambulation  Goal status: Ongoing   12/09/2023   LONG TERM GOALS: Target date: 01/27/2024  Improve patient specific functional score to at least 6 Baseline: 1 Goal status: Ongoing   12/09/2023  2.  Improve right hip and foot pain to no greater than 2/10 on the numeric pain rating scale Baseline: Foot as high as 7/10 Goal status: Ongoing   12/09/2023  3.  Improve right hip flexion to at least 95 degrees; right hip external rotation to at least 25 degrees and bilateral  ankle dorsiflexion active range of motion to at least 10 degrees Baseline: 85; 7 and 1/-1 respectively Goal status: Ongoing   12/09/2023  4.  Improve right hip strength to at least 4+/5 MMT for hip flexion, quadriceps and hip abductors Baseline: See objective Goal status: Ongoing   12/09/2023  5.  Juri will be able to walk without an assistive device or noticeable gait deviation Baseline: Wheeled walker her percent of the time Goal status: Ongoing   12/09/2023  6.  Care will be independent with her long-term maintenance home exercise program at discharge Baseline: Started 12/02/2023 Goal status: Ongoing   12/09/2023   PLAN:  PT FREQUENCY: 1-2x/week  PT DURATION: 8 weeks  PLANNED INTERVENTIONS: 97750- Physical Performance Testing, 97110-Therapeutic exercises, 97530- Therapeutic activity, 97112- Neuromuscular re-education, 97535- Self Care, 02883- Gait training, Patient/Family education, Balance training, Stair training, and Cryotherapy  PLAN FOR NEXT SESSION:   ***  Balance activities, general LE strengthening, functional activities   Susannah Daring, PT, DPT 12/29/23 8:12 AM

## 2023-12-30 ENCOUNTER — Ambulatory Visit

## 2023-12-30 DIAGNOSIS — M25551 Pain in right hip: Secondary | ICD-10-CM

## 2023-12-30 DIAGNOSIS — M25651 Stiffness of right hip, not elsewhere classified: Secondary | ICD-10-CM | POA: Diagnosis not present

## 2023-12-30 DIAGNOSIS — M6281 Muscle weakness (generalized): Secondary | ICD-10-CM | POA: Diagnosis not present

## 2023-12-30 DIAGNOSIS — R262 Difficulty in walking, not elsewhere classified: Secondary | ICD-10-CM | POA: Diagnosis not present

## 2024-01-03 NOTE — Therapy (Signed)
 OUTPATIENT PHYSICAL THERAPY TREATMENT    Patient Name: Sally Le MRN: [id number] DOB:Jan 22, 1956, 68 y.o., female Today's Date: 01/04/2024     END OF SESSION:  PT End of Session - 01/04/24 0940     Visit Number 11    Number of Visits 16    Date for Recertification  01/27/24    PT Start Time 0931    PT Stop Time 1010    PT Time Calculation (min) 39 min    Activity Tolerance Patient tolerated treatment well    Behavior During Therapy [facility] for tasks assessed/performed             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   Procedure: ARTHROPLASTY, HIP, TOTAL, ANTERIOR APPROACH;  Surgeon: Jerri Kay HERO, MD;  Location: [facility];  Service: Orthopedics;  Laterality: Right;   Patient Active Problem List   Diagnosis Date Noted   Closed subcapital fracture of neck of femur, right, initial encounter (HCC) 11/09/2023   Medication management 06/01/2023   Vitamin B12 deficiency 06/01/2023   History of drug allergy 01/11/2018   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   Allergic reaction 05/05/2017   Drug allergy 05/05/2017   Seborrheic dermatitis 02/26/2012    PCP: Corean LITTIE Ku, FNP  REFERRING PROVIDER: Ronal LITTIE Grave PA-C  REFERRING DIAG:  [phone] (ICD-10-CM) - History of total hip replacement, right    THERAPY DIAG:  Stiffness of right hip, not elsewhere classified  Pain in right hip  Muscle weakness (generalized)  Difficulty in walking, not elsewhere classified  Rationale for Evaluation and Treatment: Rehabilitation  ONSET DATE: 11/09/2023 fall, THA 11/10/2023  SUBJECTIVE:    SUBJECTIVE STATEMENT: 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   PERTINENT HISTORY: Venous insufficiency  PAIN:  NPRS scale:  tenderness 1/10 Pain location: Rt hip  Pain description: Ache and sore hip, sharp foot Aggravating factors: More with standing, wrong shoe Relieving factors: Get of foot, ice  PRECAUTIONS: Anterior hip  RED FLAGS: None   WEIGHT BEARING RESTRICTIONS: No  FALLS:  Has patient fallen in last 6 months? Yes. Number of falls 1  LIVING ENVIRONMENT: Lives with: lives with their family and lives with their spouse Lives in: House/apartment Stairs: Needs help with stairs Has following equipment at home: Single point cane and Environmental Consultant - 2 wheeled  OCCUPATION: Retired, works around the house  PLOF: Independent  PATIENT GOALS: Return to normal function; independent with chores; return to classical stretch  NEXT MD VISIT: 12/22/2023  OBJECTIVE:  Note: Objective measures were completed at Evaluation unless otherwise noted.  DIAGNOSTIC FINDINGS: Well-seated prosthesis without complication  PATIENT SURVEYS:  PSFS: THE PATIENT SPECIFIC FUNCTIONAL SCALE  Place score of 0-10 (0 = unable to perform activity and 10 = able to perform activity at the same level as before injury or problem)  Activity Date: 12/02/2023 Date: 01/04/24   Normal exercise program 0 5   2.  Get down to the floor to put on shoes 0 7   3.  Changing  sheets and doing laundry 3 10   4.      Total Score 1 22/3     Total Score = Sum of activity scores/number of activities  Minimally Detectable Change: 3 points (for single activity); 2 points (for average score)  [name] (nd). The Patient Specific Functional Scale . Retrieved from [employer]ateo[employer].com.pt   COGNITION: 12/02/2023 Overall cognitive status: Within functional limits for tasks assessed     SENSATION: 12/02/2023 Sura has no complaints of  peripheral pain or paresthesias  EDEMA:  12/02/2023 Noted but not objectively assessed  MUSCLE LENGTH: 12/02/2023 Hamstrings: Right 40 deg; Left 40 deg   LOWER EXTREMITY ROM:  ROM Right 12/02/2023 PROM Left 12/02/2023 PROM Rt 12/30/2023 AROM   Hip flexion 100/85 100 88deg  Hip extension     Hip abduction     Hip adduction     Hip internal rotation 7 14   Hip external rotation 7 30   Knee flexion     Knee extension     Ankle dorsiflexion 1 1   Ankle plantarflexion     Ankle inversion     Ankle eversion      (Blank rows = not tested)  LOWER EXTREMITY MMT:  MMT Right 12/02/2023 Left 12/02/2023  Hip flexion 4 4+  Hip extension    Hip abduction 3 4-  Hip adduction    Hip internal rotation    Hip external rotation    Knee flexion    Knee extension 4- 4-  Ankle dorsiflexion    Ankle plantarflexion    Ankle inversion    Ankle eversion     (Blank rows = not tested)  GAIT: 12/02/2023 Distance walked: 50 feet Assistive device utilized: Environmental Consultant - 2 wheeled Level of assistance: Modified independence Comments: Analy has been using a wheeled walker since surgery                   TREATMENT   RTHA     DATE: 01/04/24 TherEx:  Recumbent bike level 3 for 8 minutes  Seated TB hip abd 2x10 G  TherAct:  Seated Marching with 1# 20x Seated LAQ 1# 2x10 Seated SLR 0# 2x10 Sit to stand holding 2# ball Gait training on stairs with hand rail use on the R down and L up    TREATMENT   RTHA     DATE: 12/30/23 TherEx:  Recumbent bike level 2 for 8 minutes  Sit<>stands from arm chair without UE use 2x5 ; 1x8 with UE use and verbal cues for appropriate form  Lateral step ups with 4 step 2x15 each side  PT discussed POC, exercises at home and transition to return to full activity, how hip surgery/soreness can lead to back pain due to changing biomechanics with walking/activity, when to see PCP about back pain symptoms, and how fear and anxiety play a large role in recovery  after a traumatic event    TREATMENT   RTHA     DATE: 12/28/23 TherEx:  Recumbent bike level 3 for 8 minutes  Unilat leg press 2x12   TherAct:  Seated push the gas pedal with BOSU dome side up 1x15 with 2s hold  Seated push the gas pedal then the brake 1x15 with 2s hold in each spot  Seated push the gas pedal then quick to the brake 1x30 Bilat leg press 2x15 with 56#  Step up and over 6 step 1x10 with bilat UE use    TREATMENT   RTHA  DATE: 12/23/23 TherEx:  Recumbent bike level 3 for 8 minutes  Rt leg press 2x15 with 25#   TherAct:  Patient performed stairs with SPC and one handrails with step to pattern;  tSBA secondary to appropriate and steady performance  Bilat leg press 2x15 with 50#  Sit to stand 2# 2x10 no UE Step ups and taps on 6 step  Gait with SPC  TREATMENT        DATE: 12/21/23 TherEx:  Recumbent bike level 3 for 8 minutes   TherAct:  Patient performed stairs with SPC and one handrails with step to pattern; began with CGA though decreased to SBA secondary to appropriate and steady performance  Bilat leg press 2x15 with 50#  Rt leg press 2x15 with 18#  Seated reaching to floor to simulate picking something up/putting on socks 1x8    TREATMENT        DATE: 12/16/23 Gait Training:  PT educated and demonstrated on on 3-point vs modified 2-point technique Patient ambulated with SPC with stand alone tip and SBA for 99.5' using modified 2-point technique ; no overt gait deviations or large differences between Rt and Lt step lengths and stance times  Patient ambulated without AD and CGA for 99.5' with downward gaze and fearful gait, though no overt trunk lean or antalgic mechanics  Discussed with patient where to find the stand alone tip, how to apply, appropriate height for cane for optimal mechanics, and transition to mostly full time use of cane with use of RW only in times when feeling unstable/sore/tired/long distances  TherEx: Nustep level 4 for 8  minutes with bilat UE and LE  Sit<>stands from mat table 2x12 with no UE use   Step ups with Rt LE using 4 step 2x8 with UE use throughout  PT discussed protocol/precautions following anterior hip replacements and when to follow up with MD about questions        PATIENT EDUCATION:  12/02/2023 Education details: See above Person educated: Patient and Spouse Education method: Explanation, Demonstration, Actor cues, Verbal cues, and Handouts Education comprehension: verbalized understanding, returned demonstration, verbal cues required, tactile cues required, and needs further education  HOME EXERCISE PROGRAM: Access Code: [email or web address] URL: [email or web address] Date: 12/02/2023 Prepared by: Lamar Ivory  Exercises - Yoga Bridge  - 2 x daily - 7 x weekly - 2 sets - 10 reps - 3-5 seconds hold - Supine Figure 4 Piriformis Stretch  - 2 x daily - 7 x weekly - 1 sets - 5 reps - 20 seconds hold - Slant Board Gastrocnemius Stretch  - 3 x daily - 7 x weekly - 1 sets - 3 reps - 60 seconds hold - Slant Board Soleus Stretch  - 3 x daily - 7 x weekly - 1 sets - 3 reps - 60 seconds hold  ASSESSMENT:  CLINICAL IMPRESSION: Patient needed SBA and use of handrail for stair work.  Needed VC for form and sequencing. Not independent or able to alternate with stairs.  OBJECTIVE IMPAIRMENTS: Abnormal gait, decreased activity tolerance, decreased endurance, decreased knowledge of condition, difficulty walking, decreased ROM, decreased strength, increased edema, impaired perceived functional ability, and pain.   ACTIVITY LIMITATIONS: carrying, lifting, bending, sitting, standing, squatting, sleeping, stairs, bed mobility, dressing, and locomotion level  PARTICIPATION LIMITATIONS: meal prep, cleaning, laundry, driving, shopping, and community activity  PERSONAL FACTORS: Venous insufficiency may also affect Imari's functional outcome.   REHAB POTENTIAL: Good  CLINICAL DECISION MAKING:  Stable/uncomplicated  EVALUATION COMPLEXITY: Low   GOALS: Goals  reviewed with patient? Yes  SHORT TERM GOALS: Target date: 12/30/2023 Demetrice will be independent with her day 1 home exercise program  Goal status: GOAL MET, 12/30/2023  2.  Improve right hip flexion to at least 90 degrees; improve right hip external rotation to at least 20 degrees and bilateral ankle dorsiflexion to at least 5 degrees  Goal status: MET 01/04/24  3.  Siah will be able to walk with a single-point cane and the free from the wheeled walker for 100% of her ambulation  Goal status: GOAL MET, 12/30/2023   LONG TERM GOALS: Target date: 01/27/2024  Improve patient specific functional score to at least 6 Baseline: 1 Goal status:MET 01/04/24  2.  Improve right hip and foot pain to no greater than 2/10 on the numeric pain rating scale Baseline: Foot as high as 7/10 Goal status: MET 01/04/24  3.  Improve right hip flexion to at least 95 degrees; right hip external rotation to at least 25 degrees and bilateral ankle dorsiflexion active range of motion to at least 10 degrees Baseline: 85; 7 and 1/-1 respectively Goal status: Ongoing   12/09/2023  4.  Improve right hip strength to at least 4+/5 MMT for hip flexion, quadriceps and hip abductors Baseline: See objective Goal status: Ongoing   12/09/2023  5.  Geanine will be able to walk without an assistive device or noticeable gait deviation Baseline: Wheeled walker her percent of the time Goal status: Ongoing   01/04/24  6.  Care will be independent with her long-term maintenance home exercise program at discharge Baseline: Started 12/02/2023 Goal status: MET 01/04/24   PLAN:  PT FREQUENCY: 1-2x/week  PT DURATION: 8 weeks  PLANNED INTERVENTIONS: 97750- Physical Performance Testing, 97110-Therapeutic exercises, 97530- Therapeutic activity, 97112- Neuromuscular re-education, 97535- Self Care, [id number]- Gait training, Patient/Family education, Balance  training, Stair training, and Cryotherapy  PLAN FOR NEXT SESSION:   Some LTG completed but patient needs a few more weeks of PT to feel confident with stairs and walking without AD in community.      Burnard Meth, PT 01/04/24  10:01 AM

## 2024-01-04 ENCOUNTER — Ambulatory Visit

## 2024-01-04 DIAGNOSIS — M25551 Pain in right hip: Secondary | ICD-10-CM

## 2024-01-04 DIAGNOSIS — M25651 Stiffness of right hip, not elsewhere classified: Secondary | ICD-10-CM | POA: Diagnosis not present

## 2024-01-04 DIAGNOSIS — M6281 Muscle weakness (generalized): Secondary | ICD-10-CM

## 2024-01-04 DIAGNOSIS — R262 Difficulty in walking, not elsewhere classified: Secondary | ICD-10-CM

## 2024-01-05 ENCOUNTER — Other Ambulatory Visit (INDEPENDENT_AMBULATORY_CARE_PROVIDER_SITE_OTHER): Payer: Self-pay | Admitting: Otolaryngology

## 2024-01-05 ENCOUNTER — Telehealth (INDEPENDENT_AMBULATORY_CARE_PROVIDER_SITE_OTHER): Payer: Self-pay

## 2024-01-05 NOTE — Therapy (Signed)
 OUTPATIENT PHYSICAL THERAPY TREATMENT    Patient Name: Sally Le MRN: [id number] DOB:1955-03-18, 68 y.o., female Today's Date: 01/06/2024     END OF SESSION:  PT End of Session - 01/06/24 1001     Visit Number 12    Number of Visits 16    Date for Recertification  01/27/24    Authorization Type [employer] $10 copay    PT Start Time 0930    PT Stop Time 1010    PT Time Calculation (min) 40 min    Activity Tolerance Patient tolerated treatment well    Behavior During Therapy [facility] for tasks assessed/performed              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   Procedure: ARTHROPLASTY, HIP, TOTAL, ANTERIOR APPROACH;  Surgeon: Jerri Kay HERO, MD;  Location: [facility];  Service: Orthopedics;  Laterality: Right;   Patient Active Problem List   Diagnosis Date Noted   Closed subcapital fracture of neck of femur, right, initial encounter (HCC) 11/09/2023   Medication management 06/01/2023   Vitamin B12 deficiency 06/01/2023   History of drug allergy 01/11/2018   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   Allergic reaction 05/05/2017   Drug allergy 05/05/2017   Seborrheic dermatitis 02/26/2012    PCP: Corean LITTIE Ku, FNP  REFERRING PROVIDER: Ronal LITTIE Grave PA-C  REFERRING DIAG:  [phone] (ICD-10-CM) - History of total hip replacement, right    THERAPY DIAG:  Stiffness of right hip, not elsewhere classified  Pain in right hip  Muscle weakness (generalized)  Difficulty in walking, not elsewhere classified  Rationale for Evaluation and Treatment: Rehabilitation  ONSET DATE:  11/09/2023 fall, THA 11/10/2023  SUBJECTIVE:   SUBJECTIVE STATEMENT: Patient reports trying to increase alternating on stairs at home. PERTINENT HISTORY: Venous insufficiency  PAIN:  NPRS scale:  tenderness 1/10 Pain location: Rt hip  Pain description: Ache and sore hip, sharp foot Aggravating factors: More with standing, wrong shoe Relieving factors: Get of foot, ice  PRECAUTIONS: Anterior hip  RED FLAGS: None   WEIGHT BEARING RESTRICTIONS: No  FALLS:  Has patient fallen in last 6 months? Yes. Number of falls 1  LIVING ENVIRONMENT: Lives with: lives with their family and lives with their spouse Lives in: House/apartment Stairs: Needs help with stairs Has following equipment at home: Single point cane and Environmental Consultant - 2 wheeled  OCCUPATION: Retired, works around the house  PLOF: Independent  PATIENT GOALS: Return to normal function; independent with chores; return to classical stretch  NEXT MD VISIT: 12/22/2023  OBJECTIVE:  Note: Objective measures were completed at Evaluation unless otherwise noted.  DIAGNOSTIC FINDINGS: Well-seated prosthesis without complication  PATIENT SURVEYS:  PSFS: THE PATIENT SPECIFIC FUNCTIONAL SCALE  Place score of 0-10 (0 = unable to perform activity and 10 = able to perform activity at the same level as before injury or problem)  Activity Date: 12/02/2023 Date: 01/04/24   Normal exercise program 0 5   2.  Get down to the floor to put on  shoes 0 7   3.  Changing sheets and doing laundry 3 10   4.      Total Score 1 22/3     Total Score = Sum of activity scores/number of activities  Minimally Detectable Change: 3 points (for single activity); 2 points (for average score)  [name] (nd). The Patient Specific Functional Scale . Retrieved from [employer].com.pt   COGNITION: 12/02/2023 Overall cognitive status: Within functional limits for tasks  assessed     SENSATION: 12/02/2023 Gia has no complaints of peripheral pain or paresthesias  EDEMA:  12/02/2023 Noted but not objectively assessed  MUSCLE LENGTH: 12/02/2023 Hamstrings: Right 40 deg; Left 40 deg   LOWER EXTREMITY ROM:  ROM Right 12/02/2023 PROM Left 12/02/2023 PROM Rt 12/30/2023 AROM   Hip flexion 100/85 100 88deg  Hip extension     Hip abduction     Hip adduction     Hip internal rotation 7 14   Hip external rotation 7 30   Knee flexion     Knee extension     Ankle dorsiflexion 1 1   Ankle plantarflexion     Ankle inversion     Ankle eversion      (Blank rows = not tested)  LOWER EXTREMITY MMT:  MMT Right 12/02/2023 Left 12/02/2023  Hip flexion 4 4+  Hip extension    Hip abduction 3 4-  Hip adduction    Hip internal rotation    Hip external rotation    Knee flexion    Knee extension 4- 4-  Ankle dorsiflexion    Ankle plantarflexion    Ankle inversion    Ankle eversion     (Blank rows = not tested)  GAIT: 12/02/2023 Distance walked: 50 feet Assistive device utilized: Environmental Consultant - 2 wheeled Level of assistance: Modified independence Comments: Jenin has been using a wheeled walker since surgery                   TREATMENT   RTHA     DATE: 01/06/24 TherEx:  Recumbent bike level 3 for 8 minutes  Seated TB hip abd 3x10 G  TherAct:  Seated Marching with 1# 20x Seated LAQ 1# 2x10 Seated SLR 0# 2x10 Sit to stand holding 2# ball 2x10 Gait training on stairs with hand rail use on the R down and L up And ambulation 100+ feet    TREATMENT   RTHA     DATE: 01/04/24 TherEx:  Recumbent bike level 3 for 8 minutes  Seated TB hip abd 2x10 G  TherAct:  Seated Marching with 1# 20x Seated LAQ 1# 2x10 Seated SLR 0# 2x10 Sit to stand holding 2# ball Gait training on stairs with hand rail use on the R down and L up    TREATMENT   RTHA     DATE: 12/30/23 TherEx:  Recumbent bike level 2 for 8 minutes  Sit<>stands from arm chair  without UE use 2x5 ; 1x8 with UE use and verbal cues for appropriate form  Lateral step ups with 4 step 2x15 each side  PT discussed POC, exercises at home and transition to return to full activity, how hip surgery/soreness can lead to back pain due to changing biomechanics with walking/activity, when to see PCP about back pain symptoms, and how fear and anxiety play a large role in recovery after a traumatic event    TREATMENT   RTHA     DATE: 12/28/23 TherEx:  Recumbent bike level 3 for 8  minutes  Unilat leg press 2x12   TherAct:  Seated push the gas pedal with BOSU dome side up 1x15 with 2s hold  Seated push the gas pedal then the brake 1x15 with 2s hold in each spot  Seated push the gas pedal then quick to the brake 1x30 Bilat leg press 2x15 with 56#  Step up and over 6 step 1x10 with bilat UE use    TREATMENT   RTHA     DATE: 12/23/23 TherEx:  Recumbent bike level 3 for 8 minutes  Rt leg press 2x15 with 25#   TherAct:  Patient performed stairs with SPC and one handrails with step to pattern;  tSBA secondary to appropriate and steady performance  Bilat leg press 2x15 with 50#  Sit to stand 2# 2x10 no UE Step ups and taps on 6 step  Gait with SPC  TREATMENT        DATE: 12/21/23 TherEx:  Recumbent bike level 3 for 8 minutes   TherAct:  Patient performed stairs with SPC and one handrails with step to pattern; began with CGA though decreased to SBA secondary to appropriate and steady performance  Bilat leg press 2x15 with 50#  Rt leg press 2x15 with 18#  Seated reaching to floor to simulate picking something up/putting on socks 1x8    TREATMENT        DATE: 12/16/23 Gait Training:  PT educated and demonstrated on on 3-point vs modified 2-point technique Patient ambulated with SPC with stand alone tip and SBA for 99.5' using modified 2-point technique ; no overt gait deviations or large differences between Rt and Lt step lengths and stance times  Patient ambulated  without AD and CGA for 99.5' with downward gaze and fearful gait, though no overt trunk lean or antalgic mechanics  Discussed with patient where to find the stand alone tip, how to apply, appropriate height for cane for optimal mechanics, and transition to mostly full time use of cane with use of RW only in times when feeling unstable/sore/tired/long distances  TherEx: Nustep level 4 for 8 minutes with bilat UE and LE  Sit<>stands from mat table 2x12 with no UE use   Step ups with Rt LE using 4 step 2x8 with UE use throughout  PT discussed protocol/precautions following anterior hip replacements and when to follow up with MD about questions        PATIENT EDUCATION:  12/02/2023 Education details: See above Person educated: Patient and Spouse Education method: Explanation, Demonstration, Actor cues, Verbal cues, and Handouts Education comprehension: verbalized understanding, returned demonstration, verbal cues required, tactile cues required, and needs further education  HOME EXERCISE PROGRAM: Access Code: [email or web address] URL: [email or web address] Date: 12/02/2023 Prepared by: Lamar Ivory  Exercises - Yoga Bridge  - 2 x daily - 7 x weekly - 2 sets - 10 reps - 3-5 seconds hold - Supine Figure 4 Piriformis Stretch  - 2 x daily - 7 x weekly - 1 sets - 5 reps - 20 seconds hold - Slant Board Gastrocnemius Stretch  - 3 x daily - 7 x weekly - 1 sets - 3 reps - 60 seconds hold - Slant Board Soleus Stretch  - 3 x daily - 7 x weekly - 1 sets - 3 reps - 60 seconds hold  ASSESSMENT:  CLINICAL IMPRESSION: Patient needed SBA with ambulation  but no AD.   OBJECTIVE IMPAIRMENTS: Abnormal gait, decreased activity tolerance, decreased endurance, decreased knowledge of condition, difficulty walking, decreased ROM, decreased strength, increased edema,  impaired perceived functional ability, and pain.   ACTIVITY LIMITATIONS: carrying, lifting, bending, sitting, standing, squatting,  sleeping, stairs, bed mobility, dressing, and locomotion level  PARTICIPATION LIMITATIONS: meal prep, cleaning, laundry, driving, shopping, and community activity  PERSONAL FACTORS: Venous insufficiency may also affect Trenika's functional outcome.   REHAB POTENTIAL: Good  CLINICAL DECISION MAKING: Stable/uncomplicated  EVALUATION COMPLEXITY: Low   GOALS: Goals reviewed with patient? Yes  SHORT TERM GOALS: Target date: 12/30/2023 Micaiah will be independent with her day 1 home exercise program  Goal status: GOAL MET, 12/30/2023  2.  Improve right hip flexion to at least 90 degrees; improve right hip external rotation to at least 20 degrees and bilateral ankle dorsiflexion to at least 5 degrees  Goal status: MET 01/04/24  3.  Shakeda will be able to walk with a single-point cane and the free from the wheeled walker for 100% of her ambulation  Goal status: GOAL MET, 12/30/2023   LONG TERM GOALS: Target date: 01/27/2024  Improve patient specific functional score to at least 6 Baseline: 1 Goal status:MET 01/04/24  2.  Improve right hip and foot pain to no greater than 2/10 on the numeric pain rating scale Baseline: Foot as high as 7/10 Goal status: MET 01/04/24  3.  Improve right hip flexion to at least 95 degrees; right hip external rotation to at least 25 degrees and bilateral ankle dorsiflexion active range of motion to at least 10 degrees Baseline: 85; 7 and 1/-1 respectively Goal status: Ongoing   12/09/2023  4.  Improve right hip strength to at least 4+/5 MMT for hip flexion, quadriceps and hip abductors Baseline: See objective Goal status: Ongoing   12/09/2023  5.  Ryanna will be able to walk without an assistive device or noticeable gait deviation Baseline: Wheeled walker her percent of the time Goal status: Ongoing   01/04/24  6.  Care will be independent with her long-term maintenance home exercise program at discharge Baseline: Started 12/02/2023 Goal status: MET  01/04/24   PLAN:  PT FREQUENCY: 1-2x/week  PT DURATION: 8 weeks  PLANNED INTERVENTIONS: 97750- Physical Performance Testing, 97110-Therapeutic exercises, 97530- Therapeutic activity, 97112- Neuromuscular re-education, 97535- Self Care, 02883- Gait training, Patient/Family education, Balance training, Stair training, and Cryotherapy  PLAN FOR NEXT SESSION:   Continue with stairs and ambulation training.   Burnard Meth, PT 01/06/24  10:04 AM

## 2024-01-05 NOTE — Telephone Encounter (Signed)
 I spoke with pt to inform her that Dr. Tobie did send in Rx into her pharmacy for her.

## 2024-01-06 ENCOUNTER — Ambulatory Visit

## 2024-01-06 DIAGNOSIS — R262 Difficulty in walking, not elsewhere classified: Secondary | ICD-10-CM | POA: Diagnosis not present

## 2024-01-06 DIAGNOSIS — M25551 Pain in right hip: Secondary | ICD-10-CM | POA: Diagnosis not present

## 2024-01-06 DIAGNOSIS — M25651 Stiffness of right hip, not elsewhere classified: Secondary | ICD-10-CM | POA: Diagnosis not present

## 2024-01-06 DIAGNOSIS — M6281 Muscle weakness (generalized): Secondary | ICD-10-CM

## 2024-01-18 ENCOUNTER — Telehealth: Payer: Self-pay

## 2024-01-18 NOTE — Telephone Encounter (Signed)
 Copied from CRM #[id number]. Topic: General - Other >> Jan 18, 2024  1:40 PM Rosina D wrote: Reason for RMF:[id number] husband would like to be called because he has questions that he would like to ask the clinical team or the provider regarding the patient [phone]

## 2024-01-18 NOTE — Telephone Encounter (Signed)
LVM for patients spouse.

## 2024-01-20 ENCOUNTER — Encounter: Payer: Self-pay | Admitting: Physical Therapy

## 2024-01-20 ENCOUNTER — Ambulatory Visit: Admitting: Physical Therapy

## 2024-01-20 DIAGNOSIS — R262 Difficulty in walking, not elsewhere classified: Secondary | ICD-10-CM

## 2024-01-20 DIAGNOSIS — M6281 Muscle weakness (generalized): Secondary | ICD-10-CM

## 2024-01-20 DIAGNOSIS — M25651 Stiffness of right hip, not elsewhere classified: Secondary | ICD-10-CM

## 2024-01-20 DIAGNOSIS — M25551 Pain in right hip: Secondary | ICD-10-CM

## 2024-01-20 NOTE — Therapy (Signed)
 OUTPATIENT PHYSICAL THERAPY TREATMENT    Patient Name: Sally Le MRN: [id number] DOB:1955-04-20, 68 y.o., female Today's Date: 01/20/2024     END OF SESSION:  PT End of Session - 01/20/24 1101     Visit Number 13    Number of Visits 16    Date for Recertification  01/27/24    Authorization Type [employer] $10 copay    PT Start Time 1100    PT Stop Time 1146    PT Time Calculation (min) 46 min    Activity Tolerance Patient tolerated treatment well    Behavior During Therapy WFL for tasks assessed/performed          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   Procedure: ARTHROPLASTY, HIP, TOTAL, ANTERIOR APPROACH;  Surgeon: Jerri Kay HERO, MD;  Location: [facility];  Service: Orthopedics;  Laterality: Right;   Patient Active Problem List   Diagnosis Date Noted   Closed subcapital fracture of neck of femur, right, initial encounter (HCC) 11/09/2023   Medication management 06/01/2023   Vitamin B12 deficiency 06/01/2023   History of drug allergy 01/11/2018   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 Little to no pain going down but up is difficult with a little pain in lateral right hip.    PERTINENT HISTORY: Venous insufficiency  PAIN:  NPRS scale:  some times 0/10 tenderness 1/10,  highest since last PT 3/10 Pain location: Rt hip  Pain description: Ache and sore hip, sharp foot Aggravating factors: More with standing, wrong shoe Relieving factors: Get of foot, ice  PRECAUTIONS: Anterior hip  RED FLAGS: None   WEIGHT BEARING RESTRICTIONS: No  FALLS:  Has patient fallen in last 6 months? Yes. Number of falls 1  LIVING ENVIRONMENT: Lives with: lives with their family and lives with their spouse Lives in: House/apartment Stairs: Needs help with stairs Has following equipment at home: Single point cane and Environmental Consultant - 2 wheeled  OCCUPATION: Retired, works around the house  PLOF: Independent  PATIENT GOALS: Return to normal function; independent with chores; return to classical stretch  NEXT MD VISIT: 12/22/2023  OBJECTIVE:  Note: Objective measures were completed at Evaluation unless otherwise noted.  DIAGNOSTIC FINDINGS: Well-seated prosthesis without complication  PATIENT SURVEYS:  PSFS: THE PATIENT SPECIFIC FUNCTIONAL SCALE  Place score of 0-10 (0 = unable to perform activity and 10 = able to perform activity at the same level as before injury or problem)  Activity  Date: 12/02/2023 Date: 01/04/24   Normal exercise program 0 5   2.  Get down to the floor to put on shoes 0 7   3.  Changing sheets and doing laundry 3 10   4.      Total Score 1 22/3     Total Score = Sum of activity scores/number of activities  Minimally Detectable Change: 3 points (for single activity); 2 points (for average score)  [name] (nd). The Patient Specific Functional Scale . Retrieved from [email or web address].com.[email or web address]    COGNITION: 12/02/2023 Overall cognitive status: Within functional limits for tasks assessed     SENSATION: 12/02/2023 Olivianna has no complaints of peripheral pain or paresthesias  EDEMA:  12/02/2023 Noted but not objectively assessed  MUSCLE LENGTH: 12/02/2023 Hamstrings: Right 40 deg; Left 40 deg   LOWER EXTREMITY ROM:  ROM Right 12/02/2023 PROM Left 12/02/2023 PROM Rt 12/30/2023 AROM   Hip flexion 100/85 100 88deg  Hip extension     Hip abduction     Hip adduction     Hip internal rotation 7 14   Hip external rotation 7 30   Knee flexion     Knee extension     Ankle dorsiflexion 1 1   Ankle plantarflexion     Ankle inversion     Ankle eversion      (Blank rows = not tested)  LOWER EXTREMITY MMT:  MMT Right 12/02/2023 Left 12/02/2023  Hip flexion 4 4+  Hip extension    Hip abduction 3 4-  Hip adduction    Hip internal rotation    Hip external rotation    Knee flexion    Knee extension 4- 4-  Ankle dorsiflexion    Ankle plantarflexion    Ankle inversion    Ankle eversion     (Blank rows = not tested)  GAIT: 12/02/2023 Distance walked: 50 feet Assistive device utilized: Environmental Consultant - 2 wheeled Level of assistance: Modified independence Comments: Bailey has been using a wheeled walker since surgery                   TREATMENT   RTHA     DATE: 01/20/2024 Therapeutic Exercise:  Recumbent bike level 4 for 8 minutes  Per patient request discussed performing standing exercises that she previously performed with a virtual class.  Standing hip open chain rotation/circles.  PT recommended starting with chair backs on each side for bilateral upper extremity support progressing to single upper extremity support to no support as able.  PT educated on the fact that when she is doing open chain with 1 side she is also working the contralateral side close chain.  PT recommending trying exercises and virtual class but reducing reps to 1/3-1/2 initially and progressively  building as tolerated.  Given exercise appears too difficult than she should not performed at this time.  Patient verbalized understanding. Quadruped hip extension alternating LEs 5-second hold 10 reps Quadruped hip abduction/fire hydrants alternating LEs 5-second hold 10 reps PT added above to exercises to HEP.  Patient verbalized understanding.  TherAct:  PT demo and verbal cues on floor transfer using BUEs on chair seat via half kneeling.  Patient able to return demonstration with PT CGA.  Patient reports she is afraid to try this on her own yet.  Self-care PT demo and verbal cues on positioning in bed for side-lying with recommendations to use pillows to tent that she is off of her feet and another pillow between her legs from  knee to feet.  Patient verbalized understanding and will attempt a night to see if it improves her sleep. Patient also questioning positioning to return to having intimacy with her husband.  PT educated patient with options for positions, potentially using blankets to prop right lower extremity from being pushed into significant external rotation.  PT recommended attempting pelvic motions while in positions prior to having her husband's body weight.  Patient verbalized understanding and appreciation for PT recommendations.    TREATMENT   RTHA     DATE: 01/06/24 TherEx:  Recumbent bike level 3 for 8 minutes  Seated TB hip abd 3x10 G  TherAct:  Seated Marching with 1# 20x Seated LAQ 1# 2x10 Seated SLR 0# 2x10 Sit to stand holding 2# ball 2x10 Gait training on stairs with hand rail use on the R down and L up And ambulation 100+ feet    TREATMENT   RTHA     DATE: 01/04/24 TherEx:  Recumbent bike level 3 for 8 minutes  Seated TB hip abd 2x10 G  TherAct:  Seated Marching with 1# 20x Seated LAQ 1# 2x10 Seated SLR 0# 2x10 Sit to stand holding 2# ball Gait training on stairs with hand rail use on the R down and L up    TREATMENT   RTHA     DATE:  12/30/23 TherEx:  Recumbent bike level 2 for 8 minutes  Sit<>stands from arm chair without UE use 2x5 ; 1x8 with UE use and verbal cues for appropriate form  Lateral step ups with 4 step 2x15 each side  PT discussed POC, exercises at home and transition to return to full activity, how hip surgery/soreness can lead to back pain due to changing biomechanics with walking/activity, when to see PCP about back pain symptoms, and how fear and anxiety play a large role in recovery after a traumatic event    HOME EXERCISE PROGRAM: Access Code: [email or web address] URL: [email or web address] Date: 01/20/2024 Prepared by: Grayce Spatz  Exercises - Yoga Bridge  - 2 x daily - 7 x weekly - 2 sets - 10 reps - 3-5 seconds hold - Supine Figure 4 Piriformis Stretch  - 2 x daily - 7 x weekly - 1 sets - 5 reps - 20 seconds hold - Slant Board Gastrocnemius Stretch  - 3 x daily - 7 x weekly - 1 sets - 3 reps - 60 seconds hold - Slant Board Soleus Stretch  - 3 x daily - 7 x weekly - 1 sets - 3 reps - 60 seconds hold - Quadruped Alternating Leg Extensions  - 1 x daily - 7 x weekly - 1 sets - 10 reps - 5 seconds hold - Quadruped Academic Librarian  - 1 x daily - 7 x weekly - 1 sets - 10 reps - 5 seconds hold  ASSESSMENT:  CLINICAL IMPRESSION: Patient appears to understand basics of a floor transfer using a chair seat for support but needs to repeat under PT supervision to build confidence.  Patient seems to understand PT recommendations for positioning in bed to improve sleep return to having intimacy's with her husband.  Patient is reporting improvements in pain but continues to have intermittent issues limiting activities.  OBJECTIVE IMPAIRMENTS: Abnormal gait, decreased activity tolerance, decreased endurance, decreased knowledge of condition, difficulty walking, decreased ROM, decreased strength, increased edema, impaired perceived functional ability, and pain.   ACTIVITY LIMITATIONS: carrying, lifting,  bending, sitting, standing, squatting, sleeping, stairs, bed mobility, dressing, and locomotion level  PARTICIPATION LIMITATIONS: meal prep, cleaning,  laundry, driving, shopping, and community activity  PERSONAL FACTORS: Venous insufficiency may also affect Annikah's functional outcome.   REHAB POTENTIAL: Good  CLINICAL DECISION MAKING: Stable/uncomplicated  EVALUATION COMPLEXITY: Low   GOALS: Goals reviewed with patient? Yes  SHORT TERM GOALS: Target date: 12/30/2023 Kaelyn will be independent with her day 1 home exercise program  Goal status: GOAL MET, 12/30/2023  2.  Improve right hip flexion to at least 90 degrees; improve right hip external rotation to at least 20 degrees and bilateral ankle dorsiflexion to at least 5 degrees  Goal status: MET 01/04/24  3.  Annet will be able to walk with a single-point cane and the free from the wheeled walker for 100% of her ambulation  Goal status: GOAL MET, 12/30/2023   LONG TERM GOALS: Target date: 01/27/2024  Improve patient specific functional score to at least 6 Baseline: 1 Goal status:  MET 01/04/24  2.  Improve right hip and foot pain to no greater than 2/10 on the numeric pain rating scale Baseline: Foot as high as 7/10 Goal status: MET 01/04/24  3.  Improve right hip flexion to at least 95 degrees; right hip external rotation to at least 25 degrees and bilateral ankle dorsiflexion active range of motion to at least 10 degrees Baseline: 85; 7 and 1/-1 respectively Goal status: Ongoing   01/20/2024  4.  Improve right hip strength to at least 4+/5 MMT for hip flexion, quadriceps and hip abductors Baseline: See objective Goal status: Ongoing   01/20/2024  5.  Elisia will be able to walk without an assistive device or noticeable gait deviation Baseline: Wheeled walker her percent of the time Goal status: Ongoing   01/20/2024  6.  Care will be independent with her long-term maintenance home exercise program at  discharge Baseline: Started 12/02/2023 Goal status: MET 01/04/24   PLAN:  PT FREQUENCY: 1-2x/week  PT DURATION: 8 weeks  PLANNED INTERVENTIONS: 97750- Physical Performance Testing, 97110-Therapeutic exercises, 97530- Therapeutic activity, 97112- Neuromuscular re-education, 97535- Self Care, [id number]- Gait training, Patient/Family education, Balance training, Stair training, and Cryotherapy  PLAN FOR NEXT SESSION:   Assess LTG's to make a decision on discharge versus recertification.  Review floor transfer.    Claudette Wermuth, PT, DPT 01/20/2024, 5:53 PM

## 2024-01-21 ENCOUNTER — Ambulatory Visit

## 2024-01-21 DIAGNOSIS — M25551 Pain in right hip: Secondary | ICD-10-CM

## 2024-01-21 DIAGNOSIS — M6281 Muscle weakness (generalized): Secondary | ICD-10-CM

## 2024-01-21 DIAGNOSIS — R262 Difficulty in walking, not elsewhere classified: Secondary | ICD-10-CM

## 2024-01-21 DIAGNOSIS — M25651 Stiffness of right hip, not elsewhere classified: Secondary | ICD-10-CM

## 2024-01-21 NOTE — Therapy (Signed)
 OUTPATIENT PHYSICAL THERAPY TREATMENT/DC NOTE   Patient Name: Sally Le MRN: [id number] DOB:[birth year], 68 y.o., female Today's Date: 01/21/2024  PHYSICAL THERAPY DISCHARGE SUMMARY  Visits from Start of Care: 14  Current functional level related to goals / functional outcomes: See Below   Remaining deficits: See below   Education / Equipment: HEP   Patient agrees to discharge. Patient goals were met. Patient is being discharged due to meeting the stated rehab goals.    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   Procedure: ARTHROPLASTY, HIP, TOTAL, ANTERIOR APPROACH;  Surgeon: Jerri Kay HERO, MD;  Location: [facility];  Service: Orthopedics;  Laterality: Right;   Patient Active Problem List   Diagnosis Date Noted   Closed subcapital fracture of neck of femur, right, initial encounter (HCC) 11/09/2023   Medication management 06/01/2023   Vitamin B12 deficiency 06/01/2023   History of drug allergy 01/11/2018   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   Allergic reaction 05/05/2017   Drug allergy 05/05/2017   Seborrheic dermatitis 02/26/2012    PCP: Corean LITTIE Ku, FNP  REFERRING PROVIDER: Ronal LITTIE Grave  PA-C  REFERRING DIAG:  [phone] (ICD-10-CM) - History of total hip replacement, right    THERAPY DIAG:  Stiffness of right hip, not elsewhere classified  Pain in right hip  Muscle weakness (generalized)  Difficulty in walking, not elsewhere classified  Rationale for Evaluation and Treatment: Rehabilitation  ONSET DATE: 11/09/2023 fall, THA 11/10/2023  SUBJECTIVE:   SUBJECTIVE STATEMENT: She states she tried driving and its okay and tried some of the new exercises and they are going well.  PERTINENT HISTORY: Venous insufficiency  PAIN:  NPRS scale:  1/10,  highest 2/10 Pain location: Rt hip  Pain description: Ache and sore hip, sharp foot Aggravating factors: More with standing, wrong shoe Relieving factors: Get of foot, ice  PRECAUTIONS: Anterior hip  RED FLAGS: None   WEIGHT BEARING RESTRICTIONS: No  FALLS:  Has patient fallen in last 6 months? Yes. Number of falls 1  LIVING ENVIRONMENT: Lives with: lives with their family and lives with their spouse Lives in: House/apartment Stairs: Needs help with stairs Has following equipment at home: Single point cane and Environmental Consultant - 2 wheeled  OCCUPATION: Retired, works around the house  PLOF: Independent  PATIENT GOALS: Return to normal function; independent with chores; return to classical stretch  NEXT MD VISIT: 12/22/2023  OBJECTIVE:  Note: Objective measures were completed at Evaluation unless otherwise noted.  DIAGNOSTIC FINDINGS:  Well-seated prosthesis without complication  PATIENT SURVEYS:  PSFS: THE PATIENT SPECIFIC FUNCTIONAL SCALE  Place score of 0-10 (0 = unable to perform activity and 10 = able to perform activity at the same level as before injury or problem)  Activity Date: 12/02/2023 Date: 01/04/24   Normal exercise program 0 5   2.  Get down to the floor to put on shoes 0 7   3.  Changing sheets and doing laundry 3 10   4.      Total Score 1 22/3     Total Score = Sum of activity  scores/number of activities  Minimally Detectable Change: 3 points (for single activity); 2 points (for average score)  [name] (nd). The Patient Specific Functional Scale . Retrieved from [email or web address].[email or web address]   COGNITION: 12/02/2023 Overall cognitive status: Within functional limits for tasks assessed     SENSATION: 12/02/2023 Berta has no complaints of peripheral pain or paresthesias  EDEMA:  12/02/2023 Noted but not objectively assessed  MUSCLE LENGTH: 12/02/2023 Hamstrings: Right 40 deg; Left 40 deg   LOWER EXTREMITY ROM:  ROM Right 12/02/2023 PROM Left 12/02/2023 PROM Rt 12/30/2023 AROM  R 01/21/24  Hip flexion 100/85 100 88deg 94  Hip extension      Hip abduction      Hip adduction      Hip internal rotation 7 14  10   Hip external rotation 7 30  25   Knee flexion      Knee extension      Ankle dorsiflexion 1 1    Ankle plantarflexion      Ankle inversion      Ankle eversion       (Blank rows = not tested)  LOWER EXTREMITY MMT:  MMT Right 12/02/2023 Left 12/02/2023 Right 01/21/24  Hip flexion 4 4+ 5-  Hip extension     Hip abduction 3 4- 4+  Hip adduction     Hip internal rotation     Hip external rotation     Knee flexion     Knee extension 4- 4- 4+  Ankle dorsiflexion     Ankle plantarflexion     Ankle inversion     Ankle eversion      (Blank rows = not tested)  GAIT: 12/02/2023 Distance walked: 50 feet Assistive device utilized: Environmental Consultant - 2 wheeled Level of assistance: Modified independence Comments: Reena has been using a wheeled walker since surgery                   TREATMENT   RTHA     DATE:01/21/24 Nustep level 5 8 min TherAct:  Seated Marching with 1# 20x Seated LAQ 1# 2x10 Seated SLR 0# 2x10 Sit to stand holding 10# KB 2x10 Floor transfer training HEP review Gait training on stairs with hand rail use on the R down and L up   TREATMENT   RTHA     DATE:  01/20/2024 Therapeutic Exercise:  Recumbent bike level 4 for 8 minutes  Per patient request discussed performing standing exercises that she previously performed with a virtual class.  Standing hip open chain rotation/circles.  PT recommended starting with chair backs on each side for bilateral upper extremity support progressing to single upper extremity support to no support as able.  PT educated on the fact that when she is doing open chain with 1 side she is also working the contralateral side close chain.  PT recommending trying exercises and virtual class but reducing reps to 1/3-1/2  initially and progressively building as tolerated.  Given exercise appears too difficult than she should not performed at this time.  Patient verbalized understanding. Quadruped hip extension alternating LEs 5-second hold 10 reps Quadruped hip abduction/fire hydrants alternating LEs 5-second hold 10 reps PT added above to exercises to HEP.  Patient verbalized understanding.  TherAct:  PT demo and verbal cues on floor transfer using BUEs on chair seat via half kneeling.  Patient able to return demonstration with PT CGA.  Patient reports she is afraid to try this on her own yet.  Self-care PT demo and verbal cues on positioning in bed for side-lying with recommendations to use pillows to tent that she is off of her feet and another pillow between her legs from knee to feet.  Patient verbalized understanding and will attempt a night to see if it improves her sleep. Patient also questioning positioning to return to having intimacy with her husband.  PT educated patient with options for positions, potentially using blankets to prop right lower extremity from being pushed into significant external rotation.  PT recommended attempting pelvic motions while in positions prior to having her husband's body weight.  Patient verbalized understanding and appreciation for PT recommendations.    TREATMENT   RTHA     DATE:  01/06/24 TherEx:  Recumbent bike level 3 for 8 minutes  Seated TB hip abd 3x10 G  TherAct:  Seated Marching with 1# 20x Seated LAQ 1# 2x10 Seated SLR 0# 2x10 Sit to stand holding 2# ball 2x10 Gait training on stairs with hand rail use on the R down and L up And ambulation 100+ feet    TREATMENT   RTHA     DATE: 01/04/24 TherEx:  Recumbent bike level 3 for 8 minutes  Seated TB hip abd 2x10 G  TherAct:  Seated Marching with 1# 20x Seated LAQ 1# 2x10 Seated SLR 0# 2x10 Sit to stand holding 2# ball Gait training on stairs with hand rail use on the R down and L up    TREATMENT   RTHA     DATE: 12/30/23 TherEx:  Recumbent bike level 2 for 8 minutes  Sit<>stands from arm chair without UE use 2x5 ; 1x8 with UE use and verbal cues for appropriate form  Lateral step ups with 4 step 2x15 each side  PT discussed POC, exercises at home and transition to return to full activity, how hip surgery/soreness can lead to back pain due to changing biomechanics with walking/activity, when to see PCP about back pain symptoms, and how fear and anxiety play a large role in recovery after a traumatic event    HOME EXERCISE PROGRAM: Access Code: [email or web address] URL: [email or web address] Date: 01/21/2024 Prepared by: Burnard Meth  Exercises - Yoga Bridge  - 2 x daily - 7 x weekly - 2 sets - 10 reps - 3-5 seconds hold - Supine Figure 4 Piriformis Stretch  - 2 x daily - 7 x weekly - 1 sets - 5 reps - 20 seconds hold - Slant Board Gastrocnemius Stretch  - 3 x daily - 7 x weekly - 1 sets - 3 reps - 60 seconds hold - Slant Board Soleus Stretch  - 3 x daily - 7 x weekly - 1 sets - 3 reps - 60 seconds hold - Quadruped Alternating Leg Extensions  - 1 x daily - 7 x weekly - 1 sets - 10 reps - 5 seconds hold - Quadruped Academic Librarian  - 1 x daily - 7 x weekly -  1 sets - 10 reps - 5 seconds hold - Seated March  - 2 x daily - 7 x weekly - 2 sets - 20 reps - Seated Long Arc Quad  - 2 x daily - 7 x  weekly - 2 sets - 10 reps - Supine Active Straight Leg Raise  - 2 x daily - 7 x weekly - 3 sets - 10 reps - Hooklying Clamshell with Resistance  - 2 x daily - 7 x weekly - 3 sets - 10 reps     ASSESSMENT:  CLINICAL IMPRESSION: Patient has had 14 visits and progressed in ROM, strength, pain control and ambulation status.  She has completed all STG and LTG.    OBJECTIVE IMPAIRMENTS: Abnormal gait, decreased activity tolerance, decreased endurance, decreased knowledge of condition, difficulty walking, decreased ROM, decreased strength, increased edema, impaired perceived functional ability, and pain.   ACTIVITY LIMITATIONS: carrying, lifting, bending, sitting, standing, squatting, sleeping, stairs, bed mobility, dressing, and locomotion level  PARTICIPATION LIMITATIONS: meal prep, cleaning, laundry, driving, shopping, and community activity  PERSONAL FACTORS: Venous insufficiency may also affect Quintasia's functional outcome.   REHAB POTENTIAL: Good  CLINICAL DECISION MAKING: Stable/uncomplicated  EVALUATION COMPLEXITY: Low   GOALS: Goals reviewed with patient? Yes  SHORT TERM GOALS: Target date: 12/30/2023  MET Tiandra will be independent with her day 1 home exercise program  Goal status: GOAL MET, 12/30/2023  2.  Improve right hip flexion to at least 90 degrees; improve right hip external rotation to at least 20 degrees and bilateral ankle dorsiflexion to at least 5 degrees  Goal status: MET 01/04/24  3.  Amaryah will be able to walk with a single-point cane and the free from the wheeled walker for 100% of her ambulation  Goal status: GOAL MET, 12/30/2023   LONG TERM GOALS: Target date: 01/27/2024  MET  Improve patient specific functional score to at least 6 Baseline: 1 Goal status:  MET 01/04/24  2.  Improve right hip and foot pain to no greater than 2/10 on the numeric pain rating scale Baseline: Foot as high as 7/10 Goal status: MET 01/04/24  3.  Improve right hip  flexion to at least 95 degrees; right hip external rotation to at least 25 degrees and bilateral ankle dorsiflexion active range of motion to at least 10 degrees Baseline: 85; 7 and 1/-1 respectively Goal status: Met  01/22/2024  4.  Improve right hip strength to at least 4+/5 MMT for hip flexion, quadriceps and hip abductors Baseline: See objective Goal status: MET 01/22/2024  5.  Zabria will be able to walk without an assistive device or noticeable gait deviation Baseline: Wheeled walker her percent of the time Goal status: MET 01/21/24  6.  Care will be independent with her long-term maintenance home exercise program at discharge Baseline: Started 12/02/2023 Goal status: MET 01/04/24   PLAN:  PT FREQUENCY: 1-2x/week  PT DURATION: 8 weeks  PLANNED INTERVENTIONS: 97750- Physical Performance Testing, 97110-Therapeutic exercises, 97530- Therapeutic activity, 97112- Neuromuscular re-education, 97535- Self Care, [id number]- Gait training, Patient/Family education, Balance training, Stair training, and Cryotherapy  PLAN FOR NEXT SESSION: DC to HEP.  Burnard Meth, PT 01/21/24  10:20 AM

## 2024-02-04 ENCOUNTER — Ambulatory Visit: Admitting: Orthopaedic Surgery

## 2024-02-04 ENCOUNTER — Other Ambulatory Visit: Payer: Self-pay

## 2024-02-04 DIAGNOSIS — S72011A Unspecified intracapsular fracture of right femur, initial encounter for closed fracture: Secondary | ICD-10-CM

## 2024-02-04 NOTE — Progress Notes (Signed)
 Post-Op Visit Note   Patient: Sally Le           Date of Birth: [birth year]           MRN: [id number] Visit Date: 02/04/2024 PCP: Alvia Corean CROME, FNP   Assessment & Plan:  Chief Complaint:  Chief Complaint  Patient presents with   Right Hip - Follow-up    Right THA 11/10/2023   Visit Diagnoses:  1. Closed subcapital fracture of neck of femur, right, initial encounter (HCC)     Plan: History of Present Illness Sally Le is a 68 year old female who presents for follow-up after right THA.  She is recovering well and has resumed daily activities including climbing stairs independently, driving, and her classical stretch exercise program. She is satisfied with her progress and notes improved ability with exercises and daily tasks.  She has improving tenderness and stiffness at the surgical site, worse with sneezing and standing. She notes a small scab at the site that she thinks may have been a stitch abscess, which is healing without antibiotics.  She has chronic venous insufficiency and usually wears knee-high compression stockings. Post-surgery she had difficulty putting them on but has recently been able to wear them again.  Physical Exam Examination of the right hip shows fully healed surgical scar.  She has a small area of the scar that is slightly herniated.  Could represent stitch abscess that has resolved.  Currently does not appear to be infected.  Assessment and Plan 3 months postop from right total hip arthroplasty, femoral neck fracture - Continue current exercise regimen as instructed by physical therapy. - Monitor scar for signs of infection. - Follow-up in three months for six-month visit. - Contact clinic if scar becomes tender, increases in size, or shows signs of infection.  Follow-Up Instructions: Return in about 3 months (around 05/04/2024).   Orders:  Orders Placed This Encounter  Procedures   XR Pelvis 1-2 Views   No orders of the  defined types were placed in this encounter.   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, pre-basal cell RIGHT cheek   Urticaria    Venous insufficiency    Vitamin D  deficiency     Family History  Problem Relation Age of Onset   Parkinson's disease Mother    Breast cancer Mother    Cancer Mother 71       Estrogen and progestin positive breast cancer   Cancer Father        oat cell lung CA with mets to brain   Pancreatic cancer Maternal Grandfather    Cancer Maternal Grandfather        pancreatic   Obesity Sister    Spondylolisthesis Sister    Asthma Sister    Cancer Maternal Aunt 17       lung cancer (non-smoker)   Heart disease Paternal Grandmother  Lung cancer Unknown     Past Surgical History:  Procedure Laterality Date   ADENOIDECTOMY     APPENDECTOMY     DERMOID CYST  EXCISION     TONSILLECTOMY     TONSILLECTOMY     TOTAL HIP ARTHROPLASTY Right 11/10/2023   Procedure: ARTHROPLASTY, HIP, TOTAL, ANTERIOR APPROACH;  Surgeon: Jerri Kay HERO, MD;  Location: [facility];  Service: Orthopedics;  Laterality: Right;   Social History   Occupational History   Occupation: Magazine Features Editor: Film/video Editor For [employer]  Tobacco Use   Smoking status: Never   Smokeless tobacco: Never  Vaping Use   Vaping status: Never Used  Substance and Sexual Activity   Alcohol   use: Yes    Alcohol /week: 1.0 standard drink of alcohol     Types: 1 Glasses of wine per week   Drug use: No   Sexual activity: Yes    Partners: Male    Birth control/protection: Post-menopausal

## 2024-03-02 ENCOUNTER — Other Ambulatory Visit: Payer: Self-pay

## 2024-05-04 ENCOUNTER — Encounter: Admitting: Orthopaedic Surgery

## 2024-05-04 ENCOUNTER — Ambulatory Visit (INDEPENDENT_AMBULATORY_CARE_PROVIDER_SITE_OTHER): Admitting: Otolaryngology

## 2024-05-05 ENCOUNTER — Encounter: Admitting: Orthopaedic Surgery

## 2024-05-30 ENCOUNTER — Encounter: Admitting: Family Medicine
